# Patient Record
Sex: Female | Born: 1975 | Race: White | Hispanic: No | Marital: Married | Smoking: Former smoker
Health system: Southern US, Community
[De-identification: ages and names within clinical notes are randomized; demographics above are authoritative.]

## PROBLEM LIST (undated history)

## (undated) DIAGNOSIS — C801 Malignant (primary) neoplasm, unspecified: Secondary | ICD-10-CM

## (undated) DIAGNOSIS — I341 Nonrheumatic mitral (valve) prolapse: Secondary | ICD-10-CM

## (undated) DIAGNOSIS — R42 Dizziness and giddiness: Secondary | ICD-10-CM

## (undated) DIAGNOSIS — C4491 Basal cell carcinoma of skin, unspecified: Secondary | ICD-10-CM

## (undated) DIAGNOSIS — G4730 Sleep apnea, unspecified: Secondary | ICD-10-CM

## (undated) DIAGNOSIS — I10 Essential (primary) hypertension: Secondary | ICD-10-CM

## (undated) DIAGNOSIS — K219 Gastro-esophageal reflux disease without esophagitis: Secondary | ICD-10-CM

## (undated) DIAGNOSIS — N8000 Endometriosis of the uterus, unspecified: Secondary | ICD-10-CM

## (undated) DIAGNOSIS — Z889 Allergy status to unspecified drugs, medicaments and biological substances status: Secondary | ICD-10-CM

## (undated) DIAGNOSIS — Z86018 Personal history of other benign neoplasm: Secondary | ICD-10-CM

## (undated) DIAGNOSIS — D219 Benign neoplasm of connective and other soft tissue, unspecified: Secondary | ICD-10-CM

## (undated) DIAGNOSIS — E669 Obesity, unspecified: Secondary | ICD-10-CM

## (undated) DIAGNOSIS — R519 Headache, unspecified: Secondary | ICD-10-CM

## (undated) DIAGNOSIS — R0789 Other chest pain: Secondary | ICD-10-CM

## (undated) DIAGNOSIS — K635 Polyp of colon: Secondary | ICD-10-CM

## (undated) HISTORY — PX: TUBAL LIGATION: SHX77

## (undated) HISTORY — PX: NOVASURE ABLATION: SHX5394

## (undated) HISTORY — PX: POLYPECTOMY: SHX149

## (undated) HISTORY — DX: Basal cell carcinoma of skin, unspecified: C44.91

## (undated) HISTORY — DX: Personal history of other benign neoplasm: Z86.018

---

## 2005-08-02 ENCOUNTER — Observation Stay: Payer: Self-pay | Admitting: Obstetrics and Gynecology

## 2005-08-10 ENCOUNTER — Observation Stay: Payer: Self-pay

## 2005-08-22 ENCOUNTER — Observation Stay: Payer: Self-pay | Admitting: Obstetrics and Gynecology

## 2005-09-13 ENCOUNTER — Observation Stay: Payer: Self-pay | Admitting: Obstetrics and Gynecology

## 2005-09-15 ENCOUNTER — Inpatient Hospital Stay: Payer: Self-pay | Admitting: Obstetrics and Gynecology

## 2005-09-21 ENCOUNTER — Emergency Department: Payer: Self-pay | Admitting: Emergency Medicine

## 2010-03-31 ENCOUNTER — Ambulatory Visit: Payer: Self-pay | Admitting: Obstetrics and Gynecology

## 2011-06-21 ENCOUNTER — Ambulatory Visit: Payer: Self-pay | Admitting: Obstetrics and Gynecology

## 2011-06-23 ENCOUNTER — Ambulatory Visit: Payer: Self-pay | Admitting: Obstetrics and Gynecology

## 2011-06-23 HISTORY — PX: OTHER SURGICAL HISTORY: SHX169

## 2011-06-27 LAB — PATHOLOGY REPORT

## 2011-10-24 DIAGNOSIS — C4491 Basal cell carcinoma of skin, unspecified: Secondary | ICD-10-CM

## 2011-10-24 HISTORY — DX: Basal cell carcinoma of skin, unspecified: C44.91

## 2013-09-01 ENCOUNTER — Ambulatory Visit: Payer: Self-pay | Admitting: Gastroenterology

## 2013-09-02 LAB — PATHOLOGY REPORT

## 2015-04-19 NOTE — H&P (Addendum)
Patient ID: LARESA OSHIRO is a 39 y.o. female presenting for [place], BS for pelvic pain and endometriosis. HPI:  Postablation syndrome with pain intermittently. Is interested in definitive management.  Preop workup: TVUS 02/2015: Ut wnl Post ablation Rt ov wnl Lt ov seen with a simple cyst=0.95 cm and two complex cysts= 1=1.50 cm And 2=-0.94 cm  Pap smear benign 02/2015  Hx of atypical chest pain 2 yrs ago, seen by cardiologist with normal Echo and stress test, not requiring followup. Also has GERD, relieved with omperazole, and that may have been what the chest pain was. Also has OSA- but was not aware, did not have a sleep study and is not on any treatment.  Has a hx of BTL and Novasure endometrial ablation in 2012 for AUB with endometriosis. She still occasionally has spotting, but last month had her first period since the ablation. No dysparunia. Feels crampy.  Endometriosis dx by lap 15 yrs ago. Prior surgery includes cesarean section in 2006, NSVD x1.   Past Medical History:  has a past medical history of Hypertension; Basal cell carcinoma of face (09/2002); Endometriosis of uterus; Ulcer; Obesity; GERD (gastroesophageal reflux disease); MVP (mitral valve prolapse); Atypical chest pain; Headache; Obstructive sleep apnea; and Vertigo.  Past Surgical History:  has past surgical history that includes Tubal ligation (06/23/2011); Hysteroscopy (06/23/2011); Endometrial ablation (06/23/2011); Endometriosis (2001); Cesarean section (2006); and Skin cancer. Family History: family history includes Arthritis in her other; Cancer in her other; Diabetes mellitus in her father; Gout in her father; Heart attack (age of onset: 73) in her father; Hypertension in her father; Kidney disease in her other. Social History:  reports that she has quit smoking. She has never used smokeless tobacco. She reports that she drinks alcohol. She reports that she does not use illicit drugs. OB/GYN History:  OB  History    Gravida Para Term Preterm AB TAB SAB Ectopic Multiple Living   3 2 2  1  1   2       Allergies: has No Known Allergies. Medications:  Current outpatient prescriptions:  . hydrochlorothiazide (HYDRODIURIL) 25 MG tablet, Take 1 tablet (25 mg total) by mouth once daily., Disp: 90 tablet, Rfl: 3 . omeprazole (PRILOSEC) 20 MG DR capsule, Take 1 capsule (20 mg total) by mouth once daily., Disp: 90 capsule, Rfl: 3  Review of Systems  Constitutional: Negative. Negative for fever and fatigue.  HENT: Negative.  Respiratory: Negative. Negative for shortness of breath.  Cardiovascular: Negative for chest pain and palpitations.  Gastrointestinal: Negative for abdominal pain, diarrhea and constipation.  Endocrine: Negative for cold intolerance.  Genitourinary: Negative for dysuria, frequency, hematuria, vaginal bleeding, vaginal discharge, difficulty urinating, menstrual problem, pelvic pain and dyspareunia.  Neurological: Negative for dizziness and light-headedness.  Psychiatric/Behavioral:   No changes in mood    Exam:    Physical Exam  Constitutional: She is oriented to person, place, and time. She appears well-developed and well-nourished. No distress.  Eyes: No scleral icterus.  Neck: Normal range of motion. Neck supple. No tracheal deviation present. No thyromegaly present.  Cardiovascular: Normal rate, regular rhythm and normal heart sounds.  No murmur heard. Pulmonary/Chest: Effort normal and breath sounds normal. She has no wheezes. She has no rales.  Abdominal: She exhibits no distension and no mass. There is no tenderness. There is no rebound and no guarding.  Genitourinary:   External: Tanner stage 5, normal female genitalia without lesions or masses  Bladder: Normal size without masses or tenderness, well-supported  Urethra: No lesions or discharge with palpation. Normal urethral size and location, no  prolapse  Vagina: normal physiological discharge, without lesions or masses  Cervix: normal without lesions or masses  Adnexa: normal bimanual exam without masses or fullness  Uterus: Normal size and position without masses or tenderness.   Anus/Perineum: Normal external exam  Musculoskeletal: Normal range of motion.  Lymphadenopathy:   She has no cervical adenopathy.  Neurological: She is alert and oriented to person, place, and time.  Skin: Skin is warm and dry.  Psychiatric: She has a normal mood and affect.      Impression:   The primary encounter diagnosis was postablation pain syndrome. Preoperative clearance. Diagnoses of Atypical chest pain and Morbid obesity with BMI of 40.0-44.9, adult were also pertinent to this visit.    Plan:   - Post ablation tubal sterilization syndrome (PATSS): Up to 10% of women who have undergone tubal ligation and endometrial ablation experience cyclic or intermittent pelvic pain. The etiology is unknown, but may be from active endometrium that is trapped in the uterine cornua or uterine contracture and intrauterine scarring. The dx is clinical; ultrasound is not reliable at making the dx and it is confirmed surgically. Definitive treatment is hysterectomy. - Hysterectomy was discussed with the patient. In setting of endometriosis, prior C/S and BMI of 45, I have recommended robotically assisted laproscopic hysterectomy.  1. Cardiology clearance prior to surgery - no functional disabilities, normal EKG. Routine cardiac monitoring is recommended. Cardiac risk is low. 2. Consented for [place], BS with cystoscopy and possible fulguration of endometriosis. We will leave her ovaries in place.  I recommend robotically assisted surgery because of her known endometriosis, morbid obesity and hx of prior C/S. Entry into Palmer's point because of prior laproscopic incision in bellybutton.  Patient returns for a preoperative  discussion regarding her plans to proceed with surgical treatment of her postablation sterilization syndrome by RATLH, BS procedure. The patient and I discussed the technical aspects of the procedure including the potential for risks and complications. These include but are not limited to the risk of infection requiring post-operative antibiotics or further procedures. We talked about the risk of injury to adjacent organs including bladder, bowel, ureter, blood vessels or nerves. We talked about the need to convert to an open incision. We talked about the possible need for blood transfusion. We talked aboutpostop complications such asthromboembolic or cardiopulmonary complications. All of her questions were answered. Her preoperative exam was completed and the appropriate consents were signed. She is scheduled to undergo this procedure in the near future.  2 weeks postop f/u  Sherrie George, MD

## 2015-04-23 HISTORY — PX: ABDOMINAL HYSTERECTOMY: SHX81

## 2015-04-27 ENCOUNTER — Encounter
Admission: RE | Admit: 2015-04-27 | Discharge: 2015-04-27 | Disposition: A | Discharge status: Home / Self Care | Payer: 59 | Source: Ambulatory Visit | Attending: Obstetrics and Gynecology | Admitting: Obstetrics and Gynecology

## 2015-04-27 DIAGNOSIS — Z01812 Encounter for preprocedural laboratory examination: Secondary | ICD-10-CM | POA: Insufficient documentation

## 2015-04-27 HISTORY — DX: Nonrheumatic mitral (valve) prolapse: I34.1

## 2015-04-27 HISTORY — DX: Essential (primary) hypertension: I10

## 2015-04-27 HISTORY — DX: Gastro-esophageal reflux disease without esophagitis: K21.9

## 2015-04-27 HISTORY — DX: Malignant (primary) neoplasm, unspecified: C80.1

## 2015-04-27 HISTORY — DX: Sleep apnea, unspecified: G47.30

## 2015-04-27 LAB — ABO/RH: ABO/RH(D): AB POS

## 2015-04-27 LAB — TYPE AND SCREEN
ABO/RH(D): AB POS
Antibody Screen: NEGATIVE

## 2015-04-27 LAB — BASIC METABOLIC PANEL
ANION GAP: 11 (ref 5–15)
BUN: 15 mg/dL (ref 6–20)
CO2: 29 mmol/L (ref 22–32)
Calcium: 9.2 mg/dL (ref 8.9–10.3)
Chloride: 98 mmol/L — ABNORMAL LOW (ref 101–111)
Creatinine, Ser: 0.79 mg/dL (ref 0.44–1.00)
GFR calc non Af Amer: >60 mL/min (ref >60)
Glucose, Bld: 100 mg/dL — ABNORMAL HIGH (ref 65–99)
Potassium: 3.3 mmol/L — ABNORMAL LOW (ref 3.5–5.1)
SODIUM: 138 mmol/L (ref 135–145)

## 2015-04-27 LAB — CBC
HCT: 40.4 % (ref 35.0–47.0)
HEMOGLOBIN: 14 g/dL (ref 12.0–16.0)
MCH: 30 pg (ref 26.0–34.0)
MCHC: 34.8 g/dL (ref 32.0–36.0)
MCV: 86.4 fL (ref 80.0–100.0)
Platelets: 280 10*3/uL (ref 150–440)
RBC: 4.67 MIL/uL (ref 3.80–5.20)
RDW: 13.3 % (ref 11.5–14.5)
WBC: 7.7 10*3/uL (ref 3.6–11.0)

## 2015-04-27 NOTE — Patient Instructions (Signed)
  Your procedure is scheduled on: July 11. 2016 (Monday) Report to Day Surgery. To find out your arrival time please call [phone] between 1PM - 3PM on April 29, 2105 (Friday).  Remember: Instructions that are not followed completely may result in serious medical risk, up to and including death, or upon the discretion of your surgeon and anesthesiologist your surgery may need to be rescheduled.    _x___ 1. Do not eat food or drink liquids after midnight. No gum chewing or hard candies.     ____ 2. No Alcohol for 24 hours before or after surgery.   ____ 3. Bring all medications with you on the day of surgery if instructed.    _x___ 4. Notify your doctor if there is any change in your medical condition     (cold, fever, infections).     Do not wear jewelry, make-up, hairpins, clips or nail polish.  Do not wear lotions, powders, or perfumes. You may wear deodorant.  Do not shave 48 hours prior to surgery. Men may shave face and neck.  Do not bring valuables to the hospital.    [facility] is not responsible for any belongings or valuables.               Contacts, dentures or bridgework may not be worn into surgery.  Leave your suitcase in the car. After surgery it may be brought to your room.  For patients admitted to the hospital, discharge time is determined by your                treatment team.   Patients discharged the day of surgery will not be allowed to drive home.   Please read over the following fact sheets that you were given:   Surgical Site Infection Prevention   ____ Take these medicines the morning of surgery with A SIP OF WATER:    1. Omeprazole (Omeprazole at bedtime on Sunday night)  2.   3.   4.  5.  6.  ____ Fleet Enema (as directed)   _x___ Use CHG Soap as directed  ____ Use inhalers on the day of surgery  ____ Stop metformin 2 days prior to surgery    ____ Take 1/2 of usual insulin dose the night before surgery and none on the morning of surgery.    ____ Stop Coumadin/Plavix/aspirin on   ____ Stop Anti-inflammatories on    ____ Stop supplements until after surgery.    ____ Bring C-Pap to the hospital.

## 2015-04-27 NOTE — Pre-Procedure Instructions (Signed)
Potassium results on 04/27/2015 -- 3.3. Results called and faxed  to Dr. Leafy Ro office. Instructed that patient needed to be started on potassium supplement (spoke to Vinie Sill)

## 2015-05-03 ENCOUNTER — Observation Stay
Admission: RE | Admit: 2015-05-03 | Discharge: 2015-05-04 | Disposition: A | Discharge status: Home / Self Care | Payer: 59 | Source: Ambulatory Visit | Attending: Obstetrics and Gynecology | Admitting: Obstetrics and Gynecology

## 2015-05-03 ENCOUNTER — Encounter: Payer: Self-pay | Admitting: *Deleted

## 2015-05-03 ENCOUNTER — Ambulatory Visit: Payer: 59 | Admitting: Certified Registered Nurse Anesthetist

## 2015-05-03 ENCOUNTER — Encounter
Admission: RE | Disposition: A | Discharge status: Home / Self Care | Payer: Self-pay | Source: Ambulatory Visit | Attending: Obstetrics and Gynecology

## 2015-05-03 DIAGNOSIS — G4733 Obstructive sleep apnea (adult) (pediatric): Secondary | ICD-10-CM | POA: Insufficient documentation

## 2015-05-03 DIAGNOSIS — Z6841 Body Mass Index (BMI) 40.0 and over, adult: Secondary | ICD-10-CM | POA: Diagnosis not present

## 2015-05-03 DIAGNOSIS — Z841 Family history of disorders of kidney and ureter: Secondary | ICD-10-CM | POA: Insufficient documentation

## 2015-05-03 DIAGNOSIS — Z9851 Tubal ligation status: Secondary | ICD-10-CM | POA: Insufficient documentation

## 2015-05-03 DIAGNOSIS — I10 Essential (primary) hypertension: Secondary | ICD-10-CM | POA: Insufficient documentation

## 2015-05-03 DIAGNOSIS — Z302 Encounter for sterilization: Secondary | ICD-10-CM | POA: Diagnosis not present

## 2015-05-03 DIAGNOSIS — Z8249 Family history of ischemic heart disease and other diseases of the circulatory system: Secondary | ICD-10-CM | POA: Diagnosis not present

## 2015-05-03 DIAGNOSIS — I341 Nonrheumatic mitral (valve) prolapse: Secondary | ICD-10-CM | POA: Insufficient documentation

## 2015-05-03 DIAGNOSIS — Z79899 Other long term (current) drug therapy: Secondary | ICD-10-CM | POA: Diagnosis not present

## 2015-05-03 DIAGNOSIS — Z8261 Family history of arthritis: Secondary | ICD-10-CM | POA: Insufficient documentation

## 2015-05-03 DIAGNOSIS — N888 Other specified noninflammatory disorders of cervix uteri: Secondary | ICD-10-CM | POA: Insufficient documentation

## 2015-05-03 DIAGNOSIS — D259 Leiomyoma of uterus, unspecified: Secondary | ICD-10-CM | POA: Insufficient documentation

## 2015-05-03 DIAGNOSIS — N736 Female pelvic peritoneal adhesions (postinfective): Secondary | ICD-10-CM | POA: Diagnosis not present

## 2015-05-03 DIAGNOSIS — Z85828 Personal history of other malignant neoplasm of skin: Secondary | ICD-10-CM | POA: Insufficient documentation

## 2015-05-03 DIAGNOSIS — Z90710 Acquired absence of both cervix and uterus: Secondary | ICD-10-CM | POA: Diagnosis present

## 2015-05-03 DIAGNOSIS — K219 Gastro-esophageal reflux disease without esophagitis: Secondary | ICD-10-CM | POA: Insufficient documentation

## 2015-05-03 DIAGNOSIS — R300 Dysuria: Secondary | ICD-10-CM | POA: Diagnosis not present

## 2015-05-03 DIAGNOSIS — R102 Pelvic and perineal pain: Secondary | ICD-10-CM | POA: Insufficient documentation

## 2015-05-03 DIAGNOSIS — Z9889 Other specified postprocedural states: Secondary | ICD-10-CM | POA: Insufficient documentation

## 2015-05-03 DIAGNOSIS — Z87891 Personal history of nicotine dependence: Secondary | ICD-10-CM | POA: Insufficient documentation

## 2015-05-03 HISTORY — PX: CYSTOSCOPY: SHX5120

## 2015-05-03 HISTORY — PX: ROBOTIC ASSISTED TOTAL HYSTERECTOMY: SHX6085

## 2015-05-03 LAB — I-STAT 4 (NA, K, GLUC, HGB, HCT)
HCT: 38
HEMOGLOBIN: 12.9 g/dL
POTASSIUM: 3.2 mmol/L
SODIUM: 139

## 2015-05-03 LAB — POCT PREGNANCY, URINE: Preg Test, Ur: NEGATIVE

## 2015-05-03 SURGERY — ROBOTIC ASSISTED TOTAL HYSTERECTOMY
Anesthesia: General

## 2015-05-03 MED ORDER — PROPOFOL 10 MG/ML IV BOLUS
INTRAVENOUS | Status: DC | PRN
  Administered 2015-05-03: 180 mg via INTRAVENOUS
  Administered 2015-05-03: 20 mg via INTRAVENOUS

## 2015-05-03 MED ORDER — FUROSEMIDE 10 MG/ML IJ SOLN
INTRAMUSCULAR | Status: DC | PRN
  Administered 2015-05-03: 20 mg via INTRAMUSCULAR

## 2015-05-03 MED ORDER — ONDANSETRON HCL 4 MG/2ML IJ SOLN
INTRAMUSCULAR | Status: DC | PRN
  Administered 2015-05-03: 4 mg via INTRAVENOUS

## 2015-05-03 MED ORDER — PANTOPRAZOLE SODIUM 40 MG PO TBEC
40.0000 mg | DELAYED_RELEASE_TABLET | Freq: Every day | ORAL | Status: DC
  Administered 2015-05-04: 40 mg via ORAL
  Filled 2015-05-03: qty 1

## 2015-05-03 MED ORDER — HYDROMORPHONE HCL 1 MG/ML IJ SOLN
INTRAMUSCULAR | Status: AC
  Administered 2015-05-03: 0.5 mg via INTRAVENOUS
  Filled 2015-05-03: qty 1

## 2015-05-03 MED ORDER — FENTANYL CITRATE (PF) 100 MCG/2ML IJ SOLN
25.0000 ug | INTRAMUSCULAR | Status: AC | PRN
  Administered 2015-05-03 (×6): 25 ug via INTRAVENOUS

## 2015-05-03 MED ORDER — HYDROMORPHONE HCL 1 MG/ML IJ SOLN
0.5000 mg | INTRAMUSCULAR | Status: AC | PRN
  Administered 2015-05-03 (×4): 0.5 mg via INTRAVENOUS

## 2015-05-03 MED ORDER — FLUMAZENIL 0.5 MG/5ML IV SOLN
INTRAVENOUS | Status: DC | PRN
  Administered 2015-05-03: 0.2 mg via INTRAVENOUS

## 2015-05-03 MED ORDER — SODIUM CHLORIDE 0.9 % IR SOLN
Status: DC | PRN
  Administered 2015-05-03: 1000 mL via INTRAVESICAL

## 2015-05-03 MED ORDER — MIDAZOLAM HCL 2 MG/2ML IJ SOLN
INTRAMUSCULAR | Status: DC | PRN
  Administered 2015-05-03: 1 mg via INTRAVENOUS

## 2015-05-03 MED ORDER — KETOROLAC TROMETHAMINE 30 MG/ML IJ SOLN
INTRAMUSCULAR | Status: DC | PRN
  Administered 2015-05-03: 30 mg via INTRAVENOUS

## 2015-05-03 MED ORDER — KCL IN DEXTROSE-NACL 20-5-0.45 MEQ/L-%-% IV SOLN
INTRAVENOUS | Status: DC
  Filled 2015-05-03 (×2): qty 1000

## 2015-05-03 MED ORDER — ONDANSETRON HCL 4 MG/2ML IJ SOLN: 4.0000 mg | Freq: Four times a day (QID) | INTRAMUSCULAR | Status: DC | PRN

## 2015-05-03 MED ORDER — NEOSTIGMINE METHYLSULFATE 10 MG/10ML IV SOLN
INTRAVENOUS | Status: DC | PRN
  Administered 2015-05-03: 1 mg via INTRAVENOUS
  Administered 2015-05-03: 4 mg via INTRAVENOUS

## 2015-05-03 MED ORDER — ONDANSETRON HCL 4 MG/2ML IJ SOLN: 4.0000 mg | Freq: Once | INTRAMUSCULAR | Status: DC | PRN

## 2015-05-03 MED ORDER — ROCURONIUM BROMIDE 100 MG/10ML IV SOLN
INTRAVENOUS | Status: DC | PRN
  Administered 2015-05-03 (×3): 10 mg via INTRAVENOUS
  Administered 2015-05-03: 50 mg via INTRAVENOUS
  Administered 2015-05-03 (×3): 10 mg via INTRAVENOUS
  Administered 2015-05-03: 20 mg via INTRAVENOUS
  Administered 2015-05-03 (×7): 10 mg via INTRAVENOUS

## 2015-05-03 MED ORDER — HYDROCHLOROTHIAZIDE 25 MG PO TABS
25.0000 mg | ORAL_TABLET | Freq: Every day | ORAL | Status: DC
  Administered 2015-05-03 – 2015-05-04 (×2): 25 mg via ORAL
  Filled 2015-05-03 (×2): qty 1

## 2015-05-03 MED ORDER — MORPHINE SULFATE 2 MG/ML IJ SOLN
1.0000 mg | INTRAMUSCULAR | Status: DC | PRN
  Administered 2015-05-03: 1 mg via INTRAVENOUS
  Filled 2015-05-03: qty 1

## 2015-05-03 MED ORDER — LIDOCAINE HCL (CARDIAC) 20 MG/ML IV SOLN
INTRAVENOUS | Status: DC | PRN
  Administered 2015-05-03: 100 mg via INTRAVENOUS

## 2015-05-03 MED ORDER — GLYCOPYRROLATE 0.2 MG/ML IJ SOLN
INTRAMUSCULAR | Status: DC | PRN
  Administered 2015-05-03: 0.6 mg via INTRAVENOUS
  Administered 2015-05-03: 0.2 mg via INTRAVENOUS

## 2015-05-03 MED ORDER — FENTANYL CITRATE (PF) 100 MCG/2ML IJ SOLN
INTRAMUSCULAR | Status: AC
  Administered 2015-05-03: 25 ug via INTRAVENOUS
  Filled 2015-05-03: qty 2

## 2015-05-03 MED ORDER — FENTANYL CITRATE (PF) 100 MCG/2ML IJ SOLN
INTRAMUSCULAR | Status: DC | PRN
  Administered 2015-05-03 (×3): 50 ug via INTRAVENOUS
  Administered 2015-05-03: 100 ug via INTRAVENOUS
  Administered 2015-05-03 (×7): 50 ug via INTRAVENOUS

## 2015-05-03 MED ORDER — LACTATED RINGERS IV SOLN
INTRAVENOUS | Status: DC
  Administered 2015-05-03 (×2): via INTRAVENOUS

## 2015-05-03 MED ORDER — CEFAZOLIN SODIUM-DEXTROSE 2-3 GM-% IV SOLR
INTRAVENOUS | Status: AC
  Administered 2015-05-03 (×2): 2 g via INTRAVENOUS
  Filled 2015-05-03: qty 50

## 2015-05-03 MED ORDER — DEXAMETHASONE SODIUM PHOSPHATE 4 MG/ML IJ SOLN
INTRAMUSCULAR | Status: DC | PRN
  Administered 2015-05-03: 8 mg via INTRAVENOUS

## 2015-05-03 MED ORDER — OXYCODONE-ACETAMINOPHEN 5-325 MG PO TABS: 1.0000 | ORAL_TABLET | ORAL | Status: DC | PRN

## 2015-05-03 MED ORDER — LACTATED RINGERS IV SOLN
INTRAVENOUS | Status: DC | PRN
  Administered 2015-05-03: 08:00:00 via INTRAVENOUS

## 2015-05-03 MED ORDER — CEFAZOLIN SODIUM-DEXTROSE 2-3 GM-% IV SOLR: 2.0000 g | INTRAVENOUS | Status: DC

## 2015-05-03 MED ORDER — KETOROLAC TROMETHAMINE 30 MG/ML IJ SOLN: 30.0000 mg | Freq: Once | INTRAMUSCULAR | Status: DC

## 2015-05-03 MED ORDER — LACTATED RINGERS IV SOLN
INTRAVENOUS | Status: DC
  Administered 2015-05-03 – 2015-05-04 (×2): via INTRAVENOUS

## 2015-05-03 MED ORDER — NALOXONE HCL 0.4 MG/ML IJ SOLN
INTRAMUSCULAR | Status: DC | PRN
  Administered 2015-05-03: 40 ug via INTRAVENOUS

## 2015-05-03 MED ORDER — LIDOCAINE HCL (PF) 1 % IJ SOLN
INTRAMUSCULAR | Status: AC
  Filled 2015-05-03: qty 30

## 2015-05-03 MED ORDER — KETOROLAC TROMETHAMINE 30 MG/ML IJ SOLN
30.0000 mg | Freq: Four times a day (QID) | INTRAMUSCULAR | Status: AC
  Administered 2015-05-03 (×2): 30 mg via INTRAVENOUS
  Filled 2015-05-03 (×2): qty 1

## 2015-05-03 MED ORDER — ONDANSETRON HCL 4 MG PO TABS: 4.0000 mg | ORAL_TABLET | Freq: Four times a day (QID) | ORAL | Status: DC | PRN

## 2015-05-03 MED ORDER — OXYCODONE-ACETAMINOPHEN 5-325 MG PO TABS
2.0000 | ORAL_TABLET | ORAL | Status: DC | PRN
  Administered 2015-05-03 – 2015-05-04 (×5): 2 via ORAL
  Filled 2015-05-03 (×5): qty 2

## 2015-05-03 MED ORDER — ACETAMINOPHEN 10 MG/ML IV SOLN
INTRAVENOUS | Status: AC
  Filled 2015-05-03: qty 100

## 2015-05-03 MED ORDER — ACETAMINOPHEN 10 MG/ML IV SOLN
INTRAVENOUS | Status: DC | PRN
  Administered 2015-05-03: 1000 mg via INTRAVENOUS

## 2015-05-03 SURGICAL SUPPLY — 66 items
BAG URO DRAIN 2000ML W/SPOUT (MISCELLANEOUS) ×4 IMPLANT
BARRIER ADHS 3X4 INTERCEED (GAUZE/BANDAGES/DRESSINGS) ×2 IMPLANT
BLADE SURG SZ11 CARB STEEL (BLADE) ×2 IMPLANT
CANISTER SUCT 1200ML W/VALVE (MISCELLANEOUS) ×2 IMPLANT
CANNULA SEALS 8.5MM (CANNULA) ×1
CATH FOLEY 2WAY  5CC 16FR (CATHETERS) ×1
CATH URTH 16FR FL 2W BLN LF (CATHETERS) ×1 IMPLANT
CHLORAPREP W/TINT 26ML (MISCELLANEOUS) ×2 IMPLANT
CORD BIP STRL DISP 12FT (MISCELLANEOUS) ×2 IMPLANT
COVER TIP SHEARS 8 DVNC (MISCELLANEOUS) ×1 IMPLANT
COVER TIP SHEARS 8MM DA VINCI (MISCELLANEOUS) ×1
CRADLE LAMINECT ARM (MISCELLANEOUS) ×2 IMPLANT
DEFOGGER SCOPE WARMER CLEARIFY (MISCELLANEOUS) ×2 IMPLANT
DRAPE 3 ARM ACCESS DA VINCI (DRAPES) ×1
DRAPE 3 ARM ACCESS DVNC (DRAPES) ×1 IMPLANT
DRAPE LEGGINS SURG 28X43 STRL (DRAPES) ×2 IMPLANT
DRAPE SHEET LG 3/4 BI-LAMINATE (DRAPES) IMPLANT
DRAPE UNDER BUTTOCK W/FLU (DRAPES) ×2 IMPLANT
DRSG TEGADERM 4X4.75 (GAUZE/BANDAGES/DRESSINGS) ×2 IMPLANT
FILTER LAP SMOKE EVAC STRL (MISCELLANEOUS) ×2 IMPLANT
GLOVE BIO SURGEON STRL SZ 6.5 (GLOVE) ×16 IMPLANT
GLOVE INDICATOR 7.0 STRL GRN (GLOVE) ×16 IMPLANT
GOWN STRL REUS W/ TWL LRG LVL3 (GOWN DISPOSABLE) ×8 IMPLANT
GOWN STRL REUS W/TWL LRG LVL3 (GOWN DISPOSABLE) ×8
GRASPER SUT TROCAR 14GX15 (MISCELLANEOUS) ×2 IMPLANT
IRRIGATION STRYKERFLOW (MISCELLANEOUS) ×1 IMPLANT
IRRIGATOR STRYKERFLOW (MISCELLANEOUS) ×2
IV NS 1000ML (IV SOLUTION) ×1
IV NS 1000ML BAXH (IV SOLUTION) ×1 IMPLANT
JELLY LUB 2OZ STRL (MISCELLANEOUS) ×1
JELLY LUBE 2OZ STRL (MISCELLANEOUS) ×1 IMPLANT
KIT RM TURNOVER CYSTO AR (KITS) ×2 IMPLANT
LABEL OR SOLS (LABEL) ×2 IMPLANT
LIQUID BAND (GAUZE/BANDAGES/DRESSINGS) ×2 IMPLANT
MANIPULATOR VCARE LG CRV RETR (MISCELLANEOUS) IMPLANT
MANIPULATOR VCARE STD CRV RETR (MISCELLANEOUS) ×2 IMPLANT
NS IRRIG 1000ML POUR BTL (IV SOLUTION) ×2 IMPLANT
OCCLUDER COLPOPNEUMO (BALLOONS) ×2 IMPLANT
PACK GYN LAPAROSCOPIC (MISCELLANEOUS) ×2 IMPLANT
PAD GROUND ADULT SPLIT (MISCELLANEOUS) ×2 IMPLANT
PAD OB MATERNITY 4.3X12.25 (PERSONAL CARE ITEMS) ×2 IMPLANT
PAD PREP 24X41 OB/GYN DISP (PERSONAL CARE ITEMS) IMPLANT
PAD TRENDELENBURG OR TABLE (MISCELLANEOUS) ×2 IMPLANT
SCISSORS METZENBAUM CVD 33 (INSTRUMENTS) ×2 IMPLANT
SEAL CANN 8.5 DVNC (CANNULA) ×1 IMPLANT
SET CYSTO W/LG BORE CLAMP LF (SET/KITS/TRAYS/PACK) ×2 IMPLANT
SOLUTION ELECTROLUBE (MISCELLANEOUS) ×2 IMPLANT
SUT DVC VLOC 180 0 12IN GS21 (SUTURE) ×2
SUT MNCRL AB 4-0 PS2 18 (SUTURE) ×2 IMPLANT
SUT VIC AB 0 CT2 27 (SUTURE) IMPLANT
SUT VIC AB 1 CT1 36 (SUTURE) IMPLANT
SUT VIC AB 2-0 CT1 27 (SUTURE)
SUT VIC AB 2-0 CT1 TAPERPNT 27 (SUTURE) IMPLANT
SUT VIC AB 4-0 SH 27 (SUTURE)
SUT VIC AB 4-0 SH 27XANBCTRL (SUTURE) IMPLANT
SUT VICRYL 0 AB UR-6 (SUTURE) ×2 IMPLANT
SUTURE DVC VLC 180 0 12IN GS21 (SUTURE) ×1 IMPLANT
SYR 50ML LL SCALE MARK (SYRINGE) ×4 IMPLANT
SYR BULB IRRIG 60ML STRL (SYRINGE) ×2 IMPLANT
SYRINGE 10CC LL (SYRINGE) ×2 IMPLANT
TROCAR 12M 150ML BLUNT (TROCAR) ×4 IMPLANT
TROCAR DISP BLADELESS 8 DVNC (TROCAR) ×1 IMPLANT
TROCAR DISP BLADELESS 8MM (TROCAR) ×1
TROCAR ENDO BLADELESS 11MM (ENDOMECHANICALS) ×2 IMPLANT
TROCAR XCEL 12X100 BLDLESS (ENDOMECHANICALS) ×2 IMPLANT
TUBING INSUFFLATOR HEATED (MISCELLANEOUS) ×2 IMPLANT

## 2015-05-03 NOTE — Transfer of Care (Signed)
Immediate Anesthesia Transfer of Care Note  Patient: Pamela Lee  Procedure(s) Performed: Procedure(s): ROBOTIC ASSISTED TOTAL HYSTERECTOMY/BIL.SALPINGECTOMY (N/A) CYSTOSCOPY (N/A)  Patient Location: PACU  Anesthesia Type:General  Level of Consciousness: awake and alert   Airway & Oxygen Therapy: Patient Spontanous Breathing and Patient connected to face mask oxygen  Post-op Assessment: Report given to RN and Post -op Vital signs reviewed and stable  Post vital signs: Reviewed and stable  Last Vitals:  Filed Vitals:   05/03/15 1322  BP: 131/76  Pulse: 96  Temp: 37.2 C  Resp: 16    Complications: No apparent anesthesia complications

## 2015-05-03 NOTE — Anesthesia Postprocedure Evaluation (Addendum)
  Anesthesia Post-op Note  Patient: Pamela Lee  Procedure(s) Performed: Procedure(s): ROBOTIC ASSISTED TOTAL HYSTERECTOMY/BIL.SALPINGECTOMY (N/A) CYSTOSCOPY (N/A)  Anesthesia type:General  Patient location: PACU  Post pain: Pain level controlled  Post assessment: Post-op Vital signs reviewed, Patient's Cardiovascular Status Stable, Respiratory Function Stable, Patent Airway and No signs of Nausea or vomiting  Post vital signs: Reviewed and stable  Last Vitals:  Filed Vitals:   05/03/15 1337  BP: 126/73  Pulse: 82  Temp:   Resp: 20    Level of consciousness: awake, alert  and patient cooperative  Complications: No apparent anesthesia complications Patient positioned by OR/surical staff

## 2015-05-03 NOTE — Anesthesia Procedure Notes (Signed)
Procedure Name: Intubation Performed by: Demetrius Charity Pre-anesthesia Checklist: Patient identified, Emergency Drugs available, Suction available, Patient being monitored and Timeout performed Patient Re-evaluated:Patient Re-evaluated prior to inductionOxygen Delivery Method: Circle system utilized Preoxygenation: Pre-oxygenation with 100% oxygen Intubation Type: IV induction Ventilation: Oral airway inserted - appropriate to patient size Laryngoscope Size: Mac and 3 Grade View: Grade II Tube type: Oral Tube size: 7.0 mm Number of attempts: 1 Placement Confirmation: ETT inserted through vocal cords under direct vision,  positive ETCO2 and CO2 detector Secured at: 21 cm Tube secured with: Tape Dental Injury: Teeth and Oropharynx as per pre-operative assessment

## 2015-05-03 NOTE — Anesthesia Preprocedure Evaluation (Signed)
Anesthesia Evaluation  Patient identified by MRN, date of birth, ID band Patient awake    Reviewed: Allergy & Precautions, NPO status , Patient's Chart, lab work & pertinent test results  Airway Mallampati: II  TM Distance: >3 FB Neck ROM: Full    Dental  (+) Caps   Pulmonary sleep apnea , former smoker,    Pulmonary exam normal       Cardiovascular hypertension, Normal cardiovascular exam    Neuro/Psych negative neurological ROS  negative psych ROS   GI/Hepatic Neg liver ROS, GERD-  Medicated and Controlled,  Endo/Other  negative endocrine ROS  Renal/GU negative Renal ROS  negative genitourinary   Musculoskeletal negative musculoskeletal ROS (+)   Abdominal (+) + obese,   Peds  Hematology negative hematology ROS (+)   Anesthesia Other Findings   Reproductive/Obstetrics                             Anesthesia Physical Anesthesia Plan  ASA: II  Anesthesia Plan: General   Post-op Pain Management:    Induction: Intravenous and Rapid sequence  Airway Management Planned: Oral ETT  Additional Equipment:   Intra-op Plan:   Post-operative Plan: Extubation in OR  Informed Consent: I have reviewed the patients History and Physical, chart, labs and discussed the procedure including the risks, benefits and alternatives for the proposed anesthesia with the patient or authorized representative who has indicated his/her understanding and acceptance.   Dental advisory given  Plan Discussed with: Surgeon and CRNA  Anesthesia Plan Comments:         Anesthesia Quick Evaluation

## 2015-05-03 NOTE — Progress Notes (Signed)
POD#0 Procedure(s) (LRB): ROBOTIC ASSISTED TOTAL HYSTERECTOMY/BIL.SALPINGECTOMY (N/A) CYSTOSCOPY (N/A)  Subjective: Patient reports tolerating PO.  Significant bilateral bicep pain with difficulty moving upper arms without pain. Is actually unable to move arms flexing at elbow. Deltoids working well. No pain with passive movement. Reflexes adequate at biceps. Grip strength adequate and bilaterally symmetrical. Some skin erythema from folded BP cuff on lateral right arm. Skin temp not elevated at this site. Skin not tense. No pallor. No lower arm decreased sensation. No edema.  Objective: I have reviewed patient's vital signs, intake and output, medications and labs.  General: alert, cooperative and mild distress Resp: clear to auscultation bilaterally Cardio: regular rate and rhythm, S1, S2 normal, no murmur, click, rub or gallop GI: soft, non-tender; bowel sounds normal; no masses,  no organomegaly Extremities: as above in HPI. SCDs on lower extremities Vaginal Bleeding: minimal See HPI  Assessment: s/p Procedure(s): ROBOTIC ASSISTED TOTAL HYSTERECTOMY/BIL.SALPINGECTOMY (N/A) CYSTOSCOPY (N/A): stable, tolerating diet and upper ext pain  Plan: Advance diet Encourage ambulation  Monitor upper ext overnight. If worsening pain, consider  compartment syndrome. Would consider neuro consult in am if no improvement. Currently, with her exam intact and no edema or loss of sensation, I am working with the diagnosis of positioning that compressed her bicep and has caused muscle bruising bilaterally. We did position using sleds. Will monitor.      Benjaman Kindler 05/03/2015, 6:37 PM

## 2015-05-03 NOTE — Progress Notes (Signed)
Unable to add 5 LDA to flowsheets. Assessment below   1. Umbilicus; closed with liquid skin adhesive; margin attached and approximated, no drainage  2. Mid upper left abdomen; closed with liquid skin adhesive; margin attached and approximated, no drainage  3. Left Lower abdomen; closed with liquid skin adhesive; margin attached and approximated, no drainage   4. Mid Lower abdomen; closed with liquid skin adhesive; margin attached and approximated, no drainage   5 Right Lower abdomen; closed with liquid skin adhesive; margin attached and approximated, no drainage

## 2015-05-03 NOTE — Op Note (Signed)
Operative Note   SURGERY DATE: 05/03/2015  PRE-OP DIAGNOSIS:  1. Pelvic pain - postablation/BTL syndrome 2. Endometriosis 3. Prior pelvic surgery x2   POST-OP DIAGNOSIS:  1.  Abdominal-pelvic adhesive disease.  2. Same as above  PROCEDURES: 1. Exam under anesthesia 2. ROBOT ASSISTED LAPAROSCOPY, SURGICAL, WITH TOTAL HYSTERECTOMY, FOR UTERUS 250 G OR LESS; WITH REMOVAL OF TUBE(S)  3. Lysis of pelvic adhesions for >50% of the case 4. Cystoscopy   SURGEON: Angelina Pih, MD, MPH  ASSISTANT(S):  Lyla Glassing, PA-C  ANESTHESIA: General   ESTIMATED BLOOD LOSS: 100 mL  DRAINS: Foley catheter  TOTAL IV FLUIDS: 5427 mL  COMPLICATIONS: None  DISPOSITION: PACU - hemodynamically stable.  CONDITION: stable  Antibiotics: Ancef 2g iv within 1 hour prior to start of procedure  VTE prophylaxis: was ordered perioperatively.  Wound: Clean contaminated  IMPLANTS: None  INDICATIONS: Post-ablation pain syndrome with pelvic pain, hx of BTL and Novasure ablation in 2012. Endometriosis pathologically dx 15 yrs ago. Prior surgery includes C/S in 2006.  OPERATIVE FINDINGS:  Pelvic: External genitalia negative for lesions. Vagina negative. Adenxa negative for masses or nodularity. Cervix without gross lesions. Uterus mobile, anteverted, small.  Intraoperative findings revealed a normal upper abdomen including bowel, diaphragmatic surfaces, stomach, and omentum.  The uterus was normal.  The right and left ovaries appeared normal.  Bilateral tubes well-adhesed to bilateral ovaries. Adhesions between uterus and bladder were dense and significant. Take down of these adhesions comprised >50% of the time of this case.    PROCEDURE IN DETAIL: After informed consent was obtained, the patient was taken to the operating room where general anesthesia was obtained without difficulty. The patient was positioned in the dorsal lithotomy position in [place] and her arms were carefully tucked  at her sides and the usual precautions were taken.  She was prepped and draped in normal sterile fashion.  Time-out was performed and a Foley catheter was placed into the bladder. A standard VCare uterine manipulator was then placed in the uterus without incident.  Preoperative prophylactic antibiotics were given through her iv.  A Veress needle was carefully introduced at Palmer's point, avoiding her prior umbilical incision, with the gas on low-flow, and entry into the peritoneal cavity was assured with a low pressure noted. Pneumoperitoneum was created to a pressure of 15 mm Hg. After infiltration of local anesthetic at the proposed trocar sites, an 8 mm incision was created infraumbilically. The camera port was placed and the abdomin surveyed, noting intact bowel below the site of entry. A survey of the pelvis and upper abdomen revealed the above findings. Right and left lateral 8-mm robotic ports and a 5-12 mm right-sided assistant port were placed under direct visualization.  The patient was placed in deepTrendelenburg and the bowel was displaced up into the upper abdomen. The robot was left side docked. The instruments were placed under direct visualization.   The ureters were identified bilaterally coursing outside of the operative field. Round ligaments were divided on each side with the EndoShears and the retroperitoneal space was opened bilaterally. The posterior leaflet of the broad was taken down to the level of the IP ligament. The anterior leaflet of the broad ligament was carefully taken down to the midline.  A bladder flap was created and the bladder was dissected down off the lower uterine segment and cervix using endoshears and electrocautery.   The Fallopian tubes were divided from the ovaries, and care taken to hemostatically transect the utero-ovarian ligament. The peritoneum  was taken down to the level of the internal os, and the uterine arteries skeletonized. With strong cephalad  pressure from the V-care, bipolar cautery was used to seal and transect the uterine arteries, and the pedicles allowed to fall away laterally.  A colpotomy was performed circumferentially along the V-Care ring with monopolar electrocautery and the cervix was incised from the vagina. The specimen was removed through the vagina.  A pneumo balloon was placed in the vagina and the vaginal cuff was then closed in a running continuous fashion using the  0 V-Lock suture with careful attention to include the vaginal cuff angles and the vaginal mucosa within the closure.  Hemostasis was secured with suction-irrigation.The intraperitoneal pressure was dropped, and all planes of dissection, vascular pedicles and the vaginal cuff were found to be hemostatic.    An uncomplicated full cystoscopy was performed. Visualization from the intraabdominal cavity as well as through the cystoscopy noted that the bladder mucosa was intact at all points of the clock. Strong efflux was noted from both ureteral orifices.  The robot was undocked. The assistant port trocar was removed and the fascia was closed with 0 Vicryl suture. The lateral trocars were removed under visualization.  The CO2 gas was released and several deep breaths given to remove any remaining CO2 from the peritoneal cavity.  The skin incisions were closed with 4-0 Monocryl subcuticular stitch and pressure dressings placed.  Anesthesia was reversed without difficulty.  The patient tolerated the procedure well.  Sponge, lap and needle counts were correct x2.  The patient was taken to recovery room in excellent condition.

## 2015-05-03 NOTE — H&P (Signed)
  Date of Initial H&P: 03/02/15  History reviewed, patient examined, no change in status, stable for surgery.

## 2015-05-04 DIAGNOSIS — Z302 Encounter for sterilization: Secondary | ICD-10-CM | POA: Diagnosis not present

## 2015-05-04 LAB — SURGICAL PATHOLOGY

## 2015-05-04 LAB — URINALYSIS COMPLETE WITH MICROSCOPIC (ARMC ONLY)
Bilirubin Urine: NEGATIVE
Glucose, UA: NEGATIVE mg/dL
Ketones, ur: NEGATIVE mg/dL
Nitrite: NEGATIVE
Protein, ur: 30 mg/dL — AB
Specific Gravity, Urine: 1.019 (ref 1.005–1.030)
pH: 6 (ref 5.0–8.0)

## 2015-05-04 LAB — CBC
HCT: 35.9 % (ref 35.0–47.0)
Hemoglobin: 12.4 g/dL (ref 12.0–16.0)
MCH: 30 pg (ref 26.0–34.0)
MCHC: 34.5 g/dL (ref 32.0–36.0)
MCV: 86.9 fL (ref 80.0–100.0)
Platelets: 250 10*3/uL (ref 150–440)
RBC: 4.13 MIL/uL (ref 3.80–5.20)
RDW: 13.1 % (ref 11.5–14.5)
WBC: 10 10*3/uL (ref 3.6–11.0)

## 2015-05-04 MED ORDER — POTASSIUM CHLORIDE CRYS ER 20 MEQ PO TBCR
40.0000 meq | EXTENDED_RELEASE_TABLET | Freq: Two times a day (BID) | ORAL | Status: DC
  Administered 2015-05-04 (×2): 40 meq via ORAL
  Filled 2015-05-04 (×2): qty 2

## 2015-05-04 MED ORDER — IBUPROFEN 600 MG PO TABS: 600.0000 mg | ORAL_TABLET | Freq: Four times a day (QID) | ORAL | Status: DC | PRN

## 2015-05-04 MED ORDER — KCL-LACTATED RINGERS 20 MEQ/L IV SOLN
INTRAVENOUS | Status: DC
  Filled 2015-05-04 (×3): qty 1000

## 2015-05-04 MED ORDER — IBUPROFEN 600 MG PO TABS: 600.0000 mg | ORAL_TABLET | Freq: Four times a day (QID) | ORAL | Status: AC | PRN

## 2015-05-04 MED ORDER — POTASSIUM CHLORIDE CRYS ER 20 MEQ PO TBCR: 40.0000 meq | EXTENDED_RELEASE_TABLET | Freq: Once | ORAL | Status: DC

## 2015-05-04 MED ORDER — DOCUSATE SODIUM 100 MG PO CAPS
100.0000 mg | ORAL_CAPSULE | Freq: Every day | ORAL | Status: DC
  Administered 2015-05-04: 100 mg via ORAL
  Filled 2015-05-04: qty 1

## 2015-05-04 MED ORDER — OXYCODONE-ACETAMINOPHEN 5-325 MG PO TABS: 1.0000 | ORAL_TABLET | ORAL | Status: DC | PRN

## 2015-05-04 MED ORDER — DOCUSATE SODIUM 100 MG PO CAPS: 100.0000 mg | ORAL_CAPSULE | Freq: Every day | ORAL | Status: DC

## 2015-05-04 NOTE — Progress Notes (Signed)
Patient discharge to home via wheelchair with spouse. Prescriptions given to patient.

## 2015-05-04 NOTE — Discharge Summary (Signed)
Physician Discharge Summary  Patient ID: Pamela Lee MRN: [id number] DOB/AGE: August 26, 1976 39 y.o.  Admit date: 05/03/2015 Discharge date: 05/04/2015  Admission Diagnoses:  Discharge Diagnoses:  Active Problems:   S/P hysterectomy   Discharged Condition: good  Hospital Course: Admitted after [place], BS for pelvic pain and endometriosis, postablation syndrome. Post op pt c/o significant bilateral upper ext pain without decreased ROM, without decreased sensation and a normal neuro exam. Pain improved by POD#1, right arm still painful and limiting purposeful motion. However, with normal exam will continue to expect improvement.  Dysuria postop day #1. Restarted beta blocker POD#1  Consults: None  Significant Diagnostic Studies: labs: K low at 2.9  Treatments: IV hydration and analgesia: Percocet, IV KCl supplementation. UA/Culture  Discharge Exam: Blood pressure 127/78, pulse 72, temperature 98.6 F (37 C), temperature source Oral, resp. rate 18, height 5\' 2"  (1.575 m), weight 113.399 kg (250 lb), SpO2 96 %. General appearance: alert, cooperative and no distress  Disposition:  Urine culture and UA prior to d/c To home in stable condition.    Medication List    TAKE these medications        acetaminophen 500 MG tablet  Commonly known as:  TYLENOL  Take 1,000 mg by mouth every 6 (six) hours as needed for mild pain or headache.     cetirizine 10 MG tablet  Commonly known as:  ZYRTEC  Take 10 mg by mouth daily as needed for allergies.     docusate sodium 100 MG capsule  Commonly known as:  COLACE  Take 1 capsule (100 mg total) by mouth daily.     hydrochlorothiazide 25 MG tablet  Commonly known as:  HYDRODIURIL  Take 25 mg by mouth daily.     omeprazole 20 MG capsule  Commonly known as:  PRILOSEC  Take 20 mg by mouth daily.     oxyCODONE-acetaminophen 5-325 MG per tablet  Commonly known as:  PERCOCET/ROXICET  Take 1 tablet by mouth every 4 (four) hours as needed for  moderate pain.     potassium chloride SA 20 MEQ tablet  Commonly known as:  K-DUR,KLOR-CON  Take 2 tablets (40 mEq total) by mouth once.           Follow-up Information    Follow up with Benjaman Kindler, MD In 2 weeks.   Specialty:  Obstetrics and Gynecology   Why:  For postop check   Contact information:   [place] [address] [phone]       Follow up with Benjaman Kindler, MD. Call in 1 week.   Specialty:  Obstetrics and Gynecology   Why:  If symptoms worsen in arm   Contact information:   [address] [place] [address] [phone]       Signed: Benjaman Kindler 05/04/2015, 1:01 PM

## 2015-05-04 NOTE — Progress Notes (Signed)
1 Day Post-Op Procedure(s) (LRB): ROBOTIC ASSISTED TOTAL HYSTERECTOMY/BIL.SALPINGECTOMY  CYSTOSCOPY  Subjective: Patient reports tolerating PO, + flatus and no problems voiding.    Objective: I have reviewed patient's vital signs, intake and output, medications and labs.  General: alert, cooperative and no distress Resp: clear to auscultation bilaterally Cardio: regular rate and rhythm, S1, S2 normal, no murmur, click, rub or gallop GI: soft, non-tender; bowel sounds normal; no masses,  no organomegaly Extremities: improved range of motion in upper extermeties, L>R, resolvigin erythema. Right arm with firm-feeling bicept muscle that is not-painful to passive motion, but tender to deep palpation.  Vaginal Bleeding: minimal  Assessment: s/p Procedure(s): ROBOTIC ASSISTED TOTAL HYSTERECTOMY/BIL.SALPINGECTOMY (N/A) CYSTOSCOPY (N/A): stable  Plan: Advance diet Encourage ambulation Advance to PO medication u/a and culture for frequency and pressure     Nassim Cosma 05/04/2015, 8:02 AM

## 2015-05-04 NOTE — Discharge Instructions (Signed)

## 2015-05-04 NOTE — Progress Notes (Signed)
RN Mason Jim Talked to [facility] MD in person during morning rounds 0840 about IV infiltration.  Per MD may start on oral K+ tabs twice a day (one now and one at 1500).  D/C IV fluids with K+.  Dr.Beasley will round again at lunch time to discuss possible discharge with pt. Repeat serum K+ at 5 pm if pt is still here per MD verbal order

## 2015-05-05 LAB — BASIC METABOLIC PANEL
Anion gap: 9 (ref 5–15)
BUN: 11 mg/dL (ref 6–20)
CALCIUM: 8.4 mg/dL — AB (ref 8.9–10.3)
CO2: 29 mmol/L (ref 22–32)
Chloride: 99 mmol/L — ABNORMAL LOW (ref 101–111)
Creatinine, Ser: 0.72 mg/dL (ref 0.44–1.00)
GFR calc Af Amer: >60 mL/min (ref >60)
GFR calc non Af Amer: >60 mL/min (ref >60)
Glucose, Bld: 106 mg/dL — ABNORMAL HIGH (ref 65–99)
POTASSIUM: 2.9 mmol/L — AB (ref 3.5–5.1)
Sodium: 137 mmol/L (ref 135–145)

## 2015-05-06 LAB — URINE CULTURE: SPECIAL REQUESTS: NORMAL

## 2015-07-15 ENCOUNTER — Other Ambulatory Visit: Payer: Self-pay | Admitting: Obstetrics and Gynecology

## 2015-07-15 DIAGNOSIS — Z1231 Encounter for screening mammogram for malignant neoplasm of breast: Secondary | ICD-10-CM

## 2015-07-21 ENCOUNTER — Ambulatory Visit
Admission: RE | Admit: 2015-07-21 | Discharge: 2015-07-21 | Disposition: A | Discharge status: Home / Self Care | Payer: 59 | Source: Ambulatory Visit | Attending: Obstetrics and Gynecology | Admitting: Obstetrics and Gynecology

## 2015-07-21 DIAGNOSIS — Z1231 Encounter for screening mammogram for malignant neoplasm of breast: Secondary | ICD-10-CM | POA: Insufficient documentation

## 2015-07-29 DIAGNOSIS — Z86018 Personal history of other benign neoplasm: Secondary | ICD-10-CM

## 2015-07-29 HISTORY — DX: Personal history of other benign neoplasm: Z86.018

## 2016-07-18 ENCOUNTER — Other Ambulatory Visit: Payer: Self-pay | Admitting: Obstetrics and Gynecology

## 2016-07-18 DIAGNOSIS — Z1231 Encounter for screening mammogram for malignant neoplasm of breast: Secondary | ICD-10-CM

## 2016-08-10 ENCOUNTER — Ambulatory Visit
Admission: RE | Admit: 2016-08-10 | Discharge: 2016-08-10 | Disposition: A | Discharge status: Home / Self Care | Payer: 59 | Source: Ambulatory Visit | Attending: Obstetrics and Gynecology | Admitting: Obstetrics and Gynecology

## 2016-08-10 DIAGNOSIS — Z1231 Encounter for screening mammogram for malignant neoplasm of breast: Secondary | ICD-10-CM

## 2017-02-15 DIAGNOSIS — H40053 Ocular hypertension, bilateral: Secondary | ICD-10-CM | POA: Diagnosis not present

## 2017-03-08 DIAGNOSIS — K219 Gastro-esophageal reflux disease without esophagitis: Secondary | ICD-10-CM | POA: Diagnosis not present

## 2017-03-08 DIAGNOSIS — I10 Essential (primary) hypertension: Secondary | ICD-10-CM | POA: Diagnosis not present

## 2017-03-08 DIAGNOSIS — Z0000 Encounter for general adult medical examination without abnormal findings: Secondary | ICD-10-CM | POA: Diagnosis not present

## 2017-03-08 DIAGNOSIS — Z23 Encounter for immunization: Secondary | ICD-10-CM | POA: Diagnosis not present

## 2017-05-03 DIAGNOSIS — J069 Acute upper respiratory infection, unspecified: Secondary | ICD-10-CM | POA: Diagnosis not present

## 2017-05-03 DIAGNOSIS — R300 Dysuria: Secondary | ICD-10-CM | POA: Diagnosis not present

## 2017-05-03 DIAGNOSIS — N390 Urinary tract infection, site not specified: Secondary | ICD-10-CM | POA: Diagnosis not present

## 2017-06-22 DIAGNOSIS — G4733 Obstructive sleep apnea (adult) (pediatric): Secondary | ICD-10-CM | POA: Diagnosis not present

## 2017-07-18 ENCOUNTER — Other Ambulatory Visit: Payer: Self-pay | Admitting: Obstetrics and Gynecology

## 2017-07-18 DIAGNOSIS — Z01419 Encounter for gynecological examination (general) (routine) without abnormal findings: Secondary | ICD-10-CM | POA: Diagnosis not present

## 2017-07-18 DIAGNOSIS — Z1231 Encounter for screening mammogram for malignant neoplasm of breast: Secondary | ICD-10-CM | POA: Diagnosis not present

## 2017-08-02 DIAGNOSIS — Z1283 Encounter for screening for malignant neoplasm of skin: Secondary | ICD-10-CM | POA: Diagnosis not present

## 2017-08-02 DIAGNOSIS — D485 Neoplasm of uncertain behavior of skin: Secondary | ICD-10-CM | POA: Diagnosis not present

## 2017-08-02 DIAGNOSIS — Z85828 Personal history of other malignant neoplasm of skin: Secondary | ICD-10-CM | POA: Diagnosis not present

## 2017-08-05 DIAGNOSIS — Z23 Encounter for immunization: Secondary | ICD-10-CM | POA: Diagnosis not present

## 2017-08-06 DIAGNOSIS — L239 Allergic contact dermatitis, unspecified cause: Secondary | ICD-10-CM | POA: Diagnosis not present

## 2017-08-08 DIAGNOSIS — L239 Allergic contact dermatitis, unspecified cause: Secondary | ICD-10-CM | POA: Diagnosis not present

## 2017-08-13 DIAGNOSIS — L239 Allergic contact dermatitis, unspecified cause: Secondary | ICD-10-CM | POA: Diagnosis not present

## 2017-08-23 ENCOUNTER — Ambulatory Visit
Admission: RE | Admit: 2017-08-23 | Discharge: 2017-08-23 | Disposition: A | Discharge status: Home / Self Care | Payer: 59 | Source: Ambulatory Visit | Attending: Obstetrics and Gynecology | Admitting: Obstetrics and Gynecology

## 2017-08-23 DIAGNOSIS — Z1231 Encounter for screening mammogram for malignant neoplasm of breast: Secondary | ICD-10-CM | POA: Insufficient documentation

## 2018-01-07 DIAGNOSIS — R509 Fever, unspecified: Secondary | ICD-10-CM | POA: Diagnosis not present

## 2018-01-07 DIAGNOSIS — J029 Acute pharyngitis, unspecified: Secondary | ICD-10-CM | POA: Diagnosis not present

## 2018-01-07 DIAGNOSIS — Z20828 Contact with and (suspected) exposure to other viral communicable diseases: Secondary | ICD-10-CM | POA: Diagnosis not present

## 2018-01-28 DIAGNOSIS — J40 Bronchitis, not specified as acute or chronic: Secondary | ICD-10-CM | POA: Diagnosis not present

## 2018-02-15 DIAGNOSIS — Z0000 Encounter for general adult medical examination without abnormal findings: Secondary | ICD-10-CM | POA: Diagnosis not present

## 2018-02-18 DIAGNOSIS — Z0000 Encounter for general adult medical examination without abnormal findings: Secondary | ICD-10-CM | POA: Diagnosis not present

## 2018-03-19 DIAGNOSIS — R748 Abnormal levels of other serum enzymes: Secondary | ICD-10-CM | POA: Diagnosis not present

## 2018-05-13 DIAGNOSIS — Z8371 Family history of colonic polyps: Secondary | ICD-10-CM | POA: Diagnosis not present

## 2018-05-13 DIAGNOSIS — Z01818 Encounter for other preprocedural examination: Secondary | ICD-10-CM | POA: Diagnosis not present

## 2018-05-13 DIAGNOSIS — Z1211 Encounter for screening for malignant neoplasm of colon: Secondary | ICD-10-CM | POA: Diagnosis not present

## 2018-07-23 ENCOUNTER — Other Ambulatory Visit: Payer: Self-pay | Admitting: Obstetrics and Gynecology

## 2018-07-23 DIAGNOSIS — Z1231 Encounter for screening mammogram for malignant neoplasm of breast: Secondary | ICD-10-CM | POA: Diagnosis not present

## 2018-07-23 DIAGNOSIS — Z01419 Encounter for gynecological examination (general) (routine) without abnormal findings: Secondary | ICD-10-CM | POA: Diagnosis not present

## 2018-07-25 DIAGNOSIS — Z23 Encounter for immunization: Secondary | ICD-10-CM | POA: Diagnosis not present

## 2018-08-12 DIAGNOSIS — D485 Neoplasm of uncertain behavior of skin: Secondary | ICD-10-CM | POA: Diagnosis not present

## 2018-08-12 DIAGNOSIS — Z85828 Personal history of other malignant neoplasm of skin: Secondary | ICD-10-CM | POA: Diagnosis not present

## 2018-08-12 DIAGNOSIS — Z1283 Encounter for screening for malignant neoplasm of skin: Secondary | ICD-10-CM | POA: Diagnosis not present

## 2018-08-26 ENCOUNTER — Ambulatory Visit
Admission: RE | Admit: 2018-08-26 | Discharge: 2018-08-26 | Disposition: A | Discharge status: Home / Self Care | Payer: 59 | Source: Ambulatory Visit | Attending: Obstetrics and Gynecology | Admitting: Obstetrics and Gynecology

## 2018-08-26 DIAGNOSIS — Z1231 Encounter for screening mammogram for malignant neoplasm of breast: Secondary | ICD-10-CM

## 2018-09-02 ENCOUNTER — Ambulatory Visit
Admission: RE | Admit: 2018-09-02 | Discharge: 2018-09-02 | Disposition: A | Discharge status: Home / Self Care | Payer: 59 | Source: Ambulatory Visit | Attending: Unknown Physician Specialty | Admitting: Unknown Physician Specialty

## 2018-09-02 ENCOUNTER — Encounter
Admission: RE | Disposition: A | Discharge status: Home / Self Care | Payer: Self-pay | Source: Ambulatory Visit | Attending: Unknown Physician Specialty

## 2018-09-02 ENCOUNTER — Ambulatory Visit: Payer: 59 | Admitting: Anesthesiology

## 2018-09-02 ENCOUNTER — Encounter: Payer: Self-pay | Admitting: *Deleted

## 2018-09-02 DIAGNOSIS — D122 Benign neoplasm of ascending colon: Secondary | ICD-10-CM | POA: Insufficient documentation

## 2018-09-02 DIAGNOSIS — I10 Essential (primary) hypertension: Secondary | ICD-10-CM | POA: Insufficient documentation

## 2018-09-02 DIAGNOSIS — D123 Benign neoplasm of transverse colon: Secondary | ICD-10-CM | POA: Diagnosis not present

## 2018-09-02 DIAGNOSIS — G4730 Sleep apnea, unspecified: Secondary | ICD-10-CM | POA: Insufficient documentation

## 2018-09-02 DIAGNOSIS — D128 Benign neoplasm of rectum: Secondary | ICD-10-CM | POA: Diagnosis not present

## 2018-09-02 DIAGNOSIS — Z6841 Body Mass Index (BMI) 40.0 and over, adult: Secondary | ICD-10-CM | POA: Diagnosis not present

## 2018-09-02 DIAGNOSIS — Z1211 Encounter for screening for malignant neoplasm of colon: Secondary | ICD-10-CM | POA: Insufficient documentation

## 2018-09-02 DIAGNOSIS — K635 Polyp of colon: Secondary | ICD-10-CM | POA: Insufficient documentation

## 2018-09-02 DIAGNOSIS — Z791 Long term (current) use of non-steroidal anti-inflammatories (NSAID): Secondary | ICD-10-CM | POA: Insufficient documentation

## 2018-09-02 DIAGNOSIS — Z79891 Long term (current) use of opiate analgesic: Secondary | ICD-10-CM | POA: Insufficient documentation

## 2018-09-02 DIAGNOSIS — K219 Gastro-esophageal reflux disease without esophagitis: Secondary | ICD-10-CM | POA: Insufficient documentation

## 2018-09-02 DIAGNOSIS — Z87891 Personal history of nicotine dependence: Secondary | ICD-10-CM | POA: Diagnosis not present

## 2018-09-02 DIAGNOSIS — Z79899 Other long term (current) drug therapy: Secondary | ICD-10-CM | POA: Diagnosis not present

## 2018-09-02 DIAGNOSIS — Z85828 Personal history of other malignant neoplasm of skin: Secondary | ICD-10-CM | POA: Diagnosis not present

## 2018-09-02 DIAGNOSIS — Z8371 Family history of colonic polyps: Secondary | ICD-10-CM | POA: Insufficient documentation

## 2018-09-02 DIAGNOSIS — D126 Benign neoplasm of colon, unspecified: Secondary | ICD-10-CM | POA: Diagnosis not present

## 2018-09-02 DIAGNOSIS — K621 Rectal polyp: Secondary | ICD-10-CM | POA: Diagnosis not present

## 2018-09-02 HISTORY — PX: COLONOSCOPY WITH PROPOFOL: SHX5780

## 2018-09-02 SURGERY — COLONOSCOPY WITH PROPOFOL
Anesthesia: General

## 2018-09-02 MED ORDER — PROPOFOL 500 MG/50ML IV EMUL
INTRAVENOUS | Status: DC | PRN
  Administered 2018-09-02: 140 ug/kg/min via INTRAVENOUS

## 2018-09-02 MED ORDER — MIDAZOLAM HCL 2 MG/2ML IJ SOLN
INTRAMUSCULAR | Status: AC
  Filled 2018-09-02: qty 2

## 2018-09-02 MED ORDER — FENTANYL CITRATE (PF) 100 MCG/2ML IJ SOLN
INTRAMUSCULAR | Status: DC | PRN
  Administered 2018-09-02 (×2): 50 ug via INTRAVENOUS

## 2018-09-02 MED ORDER — SODIUM CHLORIDE 0.9 % IV SOLN: INTRAVENOUS | Status: DC

## 2018-09-02 MED ORDER — PROPOFOL 500 MG/50ML IV EMUL
INTRAVENOUS | Status: AC
  Filled 2018-09-02: qty 50

## 2018-09-02 MED ORDER — FENTANYL CITRATE (PF) 100 MCG/2ML IJ SOLN
INTRAMUSCULAR | Status: AC
  Filled 2018-09-02: qty 2

## 2018-09-02 MED ORDER — MIDAZOLAM HCL 2 MG/2ML IJ SOLN
INTRAMUSCULAR | Status: DC | PRN
  Administered 2018-09-02: 2 mg via INTRAVENOUS

## 2018-09-02 MED ORDER — SODIUM CHLORIDE 0.9 % IV SOLN
INTRAVENOUS | Status: DC
  Administered 2018-09-02: 11:00:00 via INTRAVENOUS

## 2018-09-02 NOTE — Transfer of Care (Signed)
Immediate Anesthesia Transfer of Care Note  Patient: Pamela Lee  Procedure(s) Performed: COLONOSCOPY WITH PROPOFOL (N/A )  Patient Location: PACU  Anesthesia Type:General  Level of Consciousness: awake and sedated  Airway & Oxygen Therapy: Patient Spontanous Breathing and Patient connected to nasal cannula oxygen  Post-op Assessment: Report given to RN and Post -op Vital signs reviewed and stable  Post vital signs: Reviewed and stable  Last Vitals:  Vitals Value Taken Time  BP    Temp    Pulse    Resp    SpO2      Last Pain:  Vitals:   09/02/18 1016  TempSrc: Tympanic         Complications: No apparent anesthesia complications

## 2018-09-02 NOTE — Op Note (Signed)
[facility] Patient Name: Pamela Lee Procedure Date: 09/02/2018 10:55 AM MRN: [id number] Account #: [id number] Date of Birth: 1975/11/23 Admit Type: Outpatient Age: 42 Room: [facility] ENDO ROOM 1 Gender: Female Note Status: Finalized Procedure:            Colonoscopy Indications:          Screening for colorectal malignant neoplasm Providers:            Manya Silvas, MD Referring MD:         No Local Md, MD (Referring MD) Medicines:            Propofol per Anesthesia Complications:        No immediate complications. Procedure:            Pre-Anesthesia Assessment:                       - After reviewing the risks and benefits, the patient                        was deemed in satisfactory condition to undergo the                        procedure.                       After obtaining informed consent, the colonoscope was                        passed under direct vision. Throughout the procedure,                        the patient's blood pressure, pulse, and oxygen                        saturations were monitored continuously. The                        Colonoscope was introduced through the anus and                        advanced to the the cecum, identified by appendiceal                        orifice and ileocecal valve. The colonoscopy was                        performed without difficulty. The patient tolerated the                        procedure well. The quality of the bowel preparation                        was excellent. Findings:      Three sessile polyps were found in the transverse colon. The polyps were       diminutive in size. These polyps were removed with a jumbo cold forceps.       Resection and retrieval were complete.      A diminutive polyp was found in the ascending colon. The polyp was       sessile. The polyp was removed with a cold snare. Resection and  retrieval were complete.      Two sessile polyps were  found in the ascending colon. The polyps were       diminutive in size. These polyps were removed with a jumbo cold forceps.       Resection and retrieval were complete.      A diminutive polyp was found in the transverse colon. The polyp was       sessile. The polyp was removed with a jumbo cold forceps. Resection and       retrieval were complete. Three of these were done.      A diminutive polyp was found in the rectum. The polyp was sessile. The       polyp was removed with a jumbo cold forceps. Resection and retrieval       were complete. Very small amt of blood seen after polypectomy. Impression:           - Three diminutive polyps in the transverse colon,                        removed with a jumbo cold forceps. Resected and                        retrieved.                       - One diminutive polyp in the ascending colon, removed                        with a cold snare. Resected and retrieved.                       - Two diminutive polyps in the ascending colon, removed                        with a jumbo cold forceps. Resected and retrieved.                       - One diminutive polyp in the transverse colon, removed                        with a jumbo cold forceps. Resected and retrieved.                       - One diminutive polyp in the rectum, removed with a                        jumbo cold forceps. Resected and retrieved. Recommendation:       - Await pathology results. Manya Silvas, MD 09/02/2018 11:38:53 AM This report has been signed electronically. Number of Addenda: 0 Note Initiated On: 09/02/2018 10:55 AM Scope Withdrawal Time: 0 hours 15 minutes 39 seconds  Total Procedure Duration: 0 hours 29 minutes 33 seconds       [facility]

## 2018-09-02 NOTE — Anesthesia Procedure Notes (Signed)
Performed by: Cook-Martin, Terik Haughey °Pre-anesthesia Checklist: Patient identified, Emergency Drugs available, Suction available, Patient being monitored and Timeout performed °Patient Re-evaluated:Patient Re-evaluated prior to induction °Oxygen Delivery Method: Simple face mask °Preoxygenation: Pre-oxygenation with 100% oxygen °Induction Type: IV induction °Placement Confirmation: positive ETCO2 and CO2 detector ° ° ° ° ° ° °

## 2018-09-02 NOTE — Anesthesia Preprocedure Evaluation (Signed)
Anesthesia Evaluation  Patient identified by MRN, date of birth, ID band Patient awake    Reviewed: Allergy & Precautions, NPO status , Patient's Chart, lab work & pertinent test results  History of Anesthesia Complications (+) AWARENESS UNDER ANESTHESIA and history of anesthetic complications (? awareness with last colonsocopy)  Airway Mallampati: III       Dental   Pulmonary sleep apnea (not using CPAP) , neg COPD, former smoker,           Cardiovascular hypertension, Pt. on medications (-) Past MI and (-) CHF (-) dysrhythmias (-) Valvular Problems/Murmurs     Neuro/Psych neg Seizures    GI/Hepatic Neg liver ROS, GERD  Medicated and Controlled,  Endo/Other  neg diabetesMorbid obesity  Renal/GU negative Renal ROS     Musculoskeletal   Abdominal   Peds  Hematology   Anesthesia Other Findings   Reproductive/Obstetrics                             Anesthesia Physical Anesthesia Plan  ASA: III  Anesthesia Plan: General   Post-op Pain Management:    Induction:   PONV Risk Score and Plan: 3 and Propofol infusion, TIVA and Midazolam  Airway Management Planned: Nasal Cannula  Additional Equipment:   Intra-op Plan:   Post-operative Plan:   Informed Consent: I have reviewed the patients History and Physical, chart, labs and discussed the procedure including the risks, benefits and alternatives for the proposed anesthesia with the patient or authorized representative who has indicated his/her understanding and acceptance.     Plan Discussed with:   Anesthesia Plan Comments:         Anesthesia Quick Evaluation

## 2018-09-02 NOTE — Anesthesia Postprocedure Evaluation (Signed)
Anesthesia Post Note  Patient: Pamela Lee  Procedure(s) Performed: COLONOSCOPY WITH PROPOFOL (N/A )  Patient location during evaluation: Endoscopy Anesthesia Type: General Level of consciousness: awake and alert Pain management: pain level controlled Vital Signs Assessment: post-procedure vital signs reviewed and stable Respiratory status: spontaneous breathing and respiratory function stable Cardiovascular status: stable Anesthetic complications: no     Last Vitals:  Vitals:   09/02/18 1147 09/02/18 1207  BP: 121/77 128/77  Pulse: 87   Resp: 19   Temp:    SpO2: 96%     Last Pain:  Vitals:   09/02/18 1207  TempSrc:   PainSc: 0-No pain                 KEPHART,WILLIAM K

## 2018-09-02 NOTE — Anesthesia Post-op Follow-up Note (Signed)
Anesthesia QCDR form completed.        

## 2018-09-02 NOTE — H&P (Signed)
Primary Care Physician:  Donnamarie Rossetti, PA-C Primary Gastroenterologist:  Dr. Vira Agar  Pre-Procedure History & Physical: HPI:  AERICA Lee is a 42 y.o. female is here for an colonoscopy.   Past Medical History:  Diagnosis Date  . Cancer ([place])    basal cell  . GERD (gastroesophageal reflux disease)   . Hypertension   . Mitral valve prolapse   . Sleep apnea     Past Surgical History:  Procedure Laterality Date  . ABDOMINAL HYSTERECTOMY  04/2015  . CESAREAN SECTION    . CYSTOSCOPY N/A 05/03/2015   Procedure: CYSTOSCOPY;  Surgeon: Benjaman Kindler, MD;  Location: [facility] ORS;  Service: Gynecology;  Laterality: N/A;  . NOVASURE ABLATION    . ROBOTIC ASSISTED TOTAL HYSTERECTOMY N/A 05/03/2015   Procedure: ROBOTIC ASSISTED TOTAL HYSTERECTOMY/BIL.SALPINGECTOMY;  Surgeon: Benjaman Kindler, MD;  Location: [facility] ORS;  Service: Gynecology;  Laterality: N/A;  . TUBAL LIGATION      Prior to Admission medications   Medication Sig Start Date End Date Taking? Authorizing Provider  acetaminophen (TYLENOL) 500 MG tablet Take 1,000 mg by mouth every 6 (six) hours as needed for mild pain or headache.    Yes [provider]  cetirizine (ZYRTEC) 10 MG tablet Take 10 mg by mouth daily as needed for allergies.   Yes [provider]  hydrochlorothiazide (HYDRODIURIL) 25 MG tablet Take 25 mg by mouth daily.   Yes [provider]  ibuprofen (ADVIL,MOTRIN) 600 MG tablet Take 1 tablet (600 mg total) by mouth every 6 (six) hours as needed for fever or headache. 05/04/15  Yes Benjaman Kindler, MD  docusate sodium (COLACE) 100 MG capsule Take 1 capsule (100 mg total) by mouth daily. 05/04/15   Benjaman Kindler, MD  fexofenadine (ALLEGRA) 180 MG tablet Take 180 mg by mouth daily.    [provider]  omeprazole (PRILOSEC) 20 MG capsule Take 20 mg by mouth 2 (two) times daily before a meal. 30-45 minutes before meal    [provider]  oxyCODONE-acetaminophen  (PERCOCET/ROXICET) 5-325 MG per tablet Take 1 tablet by mouth every 4 (four) hours as needed for moderate pain. 05/04/15   Benjaman Kindler, MD  potassium chloride SA (K-DUR,KLOR-CON) 20 MEQ tablet Take 2 tablets (40 mEq total) by mouth once. 05/04/15   Benjaman Kindler, MD    Allergies as of 05/31/2018  . (No Known Allergies)    Family History  Problem Relation Age of Onset  . Hypertension Mother   . Diabetes Father   . Hypertension Father   . Breast cancer Neg Hx     Social History   Socioeconomic History  . Marital status: Married    Spouse name: Not on file  . Number of children: Not on file  . Years of education: Not on file  . Highest education level: Not on file  Occupational History  . Not on file  Social Needs  . Financial resource strain: Not on file  . Food insecurity:    Worry: Not on file    Inability: Not on file  . Transportation needs:    Medical: Not on file    Non-medical: Not on file  Tobacco Use  . Smoking status: Former Smoker    Packs/day: 0.25    Types: Cigarettes    Last attempt to quit: 04/22/2006    Years since quitting: 12.3  . Smokeless tobacco: Never Used  Substance and Sexual Activity  . Alcohol use: No  . Drug use: No  .  Sexual activity: Not on file  Lifestyle  . Physical activity:    Days per week: Not on file    Minutes per session: Not on file  . Stress: Not on file  Relationships  . Social connections:    Talks on phone: Not on file    Gets together: Not on file    Attends religious service: Not on file    Active member of club or organization: Not on file    Attends meetings of clubs or organizations: Not on file    Relationship status: Not on file  . Intimate partner violence:    Fear of current or ex partner: Not on file    Emotionally abused: Not on file    Physically abused: Not on file    Forced sexual activity: Not on file  Other Topics Concern  . Not on file  Social History Narrative  . Not on file    Review  of Systems: See HPI, otherwise negative ROS  Physical Exam: BP (!) 154/103   Pulse 93   Temp (!) 96.8 F (36 C) (Tympanic)   Resp 20   Ht 5\' 2"  (1.575 m)   Wt 117.9 kg   LMP  (LMP Unknown)   SpO2 100%   BMI 47.55 kg/m  General:   Alert,  pleasant and cooperative in NAD Head:  Normocephalic and atraumatic. Neck:  Supple; no masses or thyromegaly. Lungs:  Clear throughout to auscultation.    Heart:  Regular rate and rhythm. Abdomen:  Soft, nontender and nondistended. Normal bowel sounds, without guarding, and without rebound.   Neurologic:  Alert and  oriented x4;  grossly normal neurologically.  Impression/Plan: Pamela Lee is here for an colonoscopy to be performed for colon screening and FH colon polyps in mother and sister.  Last colon 11/10 2014.  Risks, benefits, limitations, and alternatives regarding  colonoscopy have been reviewed with the patient.  Questions have been answered.  All parties agreeable.   Gaylyn Cheers, MD  09/02/2018, 10:54 AM

## 2018-09-03 ENCOUNTER — Encounter: Payer: Self-pay | Admitting: Unknown Physician Specialty

## 2018-09-03 LAB — SURGICAL PATHOLOGY

## 2018-09-12 DIAGNOSIS — L570 Actinic keratosis: Secondary | ICD-10-CM | POA: Diagnosis not present

## 2018-11-07 DIAGNOSIS — L570 Actinic keratosis: Secondary | ICD-10-CM | POA: Diagnosis not present

## 2018-11-07 DIAGNOSIS — L578 Other skin changes due to chronic exposure to nonionizing radiation: Secondary | ICD-10-CM | POA: Diagnosis not present

## 2018-11-07 DIAGNOSIS — L821 Other seborrheic keratosis: Secondary | ICD-10-CM | POA: Diagnosis not present

## 2018-11-07 DIAGNOSIS — L739 Follicular disorder, unspecified: Secondary | ICD-10-CM | POA: Diagnosis not present

## 2019-08-08 ENCOUNTER — Other Ambulatory Visit: Payer: Self-pay | Admitting: Obstetrics and Gynecology

## 2019-08-08 DIAGNOSIS — Z1231 Encounter for screening mammogram for malignant neoplasm of breast: Secondary | ICD-10-CM

## 2019-09-11 ENCOUNTER — Ambulatory Visit
Admission: RE | Admit: 2019-09-11 | Discharge: 2019-09-11 | Disposition: A | Discharge status: Home / Self Care | Payer: 59 | Source: Ambulatory Visit | Attending: Obstetrics and Gynecology | Admitting: Obstetrics and Gynecology

## 2019-09-11 ENCOUNTER — Other Ambulatory Visit: Payer: Self-pay

## 2019-09-11 ENCOUNTER — Encounter (INDEPENDENT_AMBULATORY_CARE_PROVIDER_SITE_OTHER): Payer: Self-pay

## 2019-09-11 DIAGNOSIS — Z1231 Encounter for screening mammogram for malignant neoplasm of breast: Secondary | ICD-10-CM | POA: Insufficient documentation

## 2020-08-30 ENCOUNTER — Other Ambulatory Visit: Payer: Self-pay

## 2020-08-30 ENCOUNTER — Encounter: Payer: Self-pay | Admitting: Dermatology

## 2020-08-30 ENCOUNTER — Ambulatory Visit (INDEPENDENT_AMBULATORY_CARE_PROVIDER_SITE_OTHER): Payer: 59 | Admitting: Dermatology

## 2020-08-30 DIAGNOSIS — Z1283 Encounter for screening for malignant neoplasm of skin: Secondary | ICD-10-CM

## 2020-08-30 DIAGNOSIS — D229 Melanocytic nevi, unspecified: Secondary | ICD-10-CM

## 2020-08-30 DIAGNOSIS — L820 Inflamed seborrheic keratosis: Secondary | ICD-10-CM

## 2020-08-30 DIAGNOSIS — Z85828 Personal history of other malignant neoplasm of skin: Secondary | ICD-10-CM

## 2020-08-30 DIAGNOSIS — L719 Rosacea, unspecified: Secondary | ICD-10-CM

## 2020-08-30 DIAGNOSIS — L814 Other melanin hyperpigmentation: Secondary | ICD-10-CM

## 2020-08-30 DIAGNOSIS — Z86018 Personal history of other benign neoplasm: Secondary | ICD-10-CM | POA: Diagnosis not present

## 2020-08-30 DIAGNOSIS — L821 Other seborrheic keratosis: Secondary | ICD-10-CM

## 2020-08-30 DIAGNOSIS — L918 Other hypertrophic disorders of the skin: Secondary | ICD-10-CM

## 2020-08-30 DIAGNOSIS — L738 Other specified follicular disorders: Secondary | ICD-10-CM

## 2020-08-30 DIAGNOSIS — L905 Scar conditions and fibrosis of skin: Secondary | ICD-10-CM

## 2020-08-30 DIAGNOSIS — L818 Other specified disorders of pigmentation: Secondary | ICD-10-CM

## 2020-08-30 DIAGNOSIS — D1800 Hemangioma unspecified site: Secondary | ICD-10-CM

## 2020-08-30 DIAGNOSIS — L578 Other skin changes due to chronic exposure to nonionizing radiation: Secondary | ICD-10-CM

## 2020-08-30 NOTE — Progress Notes (Signed)
Follow-Up Visit   Subjective  Pamela Lee is a 44 y.o. female who presents for the following: Annual Exam (History of BCC and Dysplastic nevus - TBSE today). The patient presents for Total-Body Skin Exam (TBSE) for skin cancer screening and mole check.  The following portions of the chart were reviewed this encounter and updated as appropriate:  Tobacco  Allergies  Meds  Problems  Med Hx  Surg Hx  Fam Hx     Review of Systems:  No other skin or systemic complaints except as noted in HPI or Assessment and Plan.  Objective  Well appearing patient in no apparent distress; mood and affect are within normal limits.  A full examination was performed including scalp, head, eyes, ears, nose, lips, neck, chest, axillae, abdomen, back, buttocks, bilateral upper extremities, bilateral lower extremities, hands, feet, fingers, toes, fingernails, and toenails. All findings within normal limits unless otherwise noted below.  Objective  Face: Pinkness and small papules.  Objective  Right Forehead: Well healed scar with no evidence of recurrence.   Objective  Right Knee: Scar with no evidence of recurrence.   Objective  Face: Yellow papules.  Objective  Back x 3, right temple x 1, right zygoma x 2 (6): Erythematous keratotic or waxy stuck-on papule or plaque.    Assessment & Plan    Acrochordons (Skin Tags) - Fleshy, skin-colored pedunculated papules - Benign appearing.  - Observe. - If desired, they can be removed with an in office procedure that is not covered by insurance. - Please call the clinic if you notice any new or changing lesions.  Lentigines - Scattered tan macules - Discussed due to sun exposure - Benign, observe - Call for any changes  Seborrheic Keratoses - Stuck-on, waxy, tan-brown papules and plaques  - Discussed benign etiology and prognosis. - Observe - Call for any changes  Melanocytic Nevi - Tan-brown and/or pink-flesh-colored symmetric  macules and papules - Benign appearing on exam today - Observation - Call clinic for new or changing moles - Recommend daily use of broad spectrum spf 30+ sunscreen to sun-exposed areas.   Hemangiomas - Red papules - Discussed benign nature - Observe - Call for any changes  Actinic Damage - Chronic, secondary to cumulative UV/sun exposure - diffuse scaly erythematous macules with underlying dyspigmentation - Recommend daily broad spectrum sunscreen SPF 30+ to sun-exposed areas, reapply every 2 hours as needed.  - Call for new or changing lesions.  Skin cancer screening performed today.  Graphite Tattoo of right forehead Benign-appearing.  Observation.  Call clinic for new or changing lesions.  Recommend daily use of broad spectrum spf 30+ sunscreen to sun-exposed areas.   Traumatic scar of left forehead For years. Benign-appearing.  Observation.  Call clinic for new or changing lesions.  Recommend daily use of broad spectrum spf 30+ sunscreen to sun-exposed areas.   Rosacea Face Rosacea is a chronic progressive skin condition usually affecting the face of adults. It is treatable but not curable. It sometimes affects the eyes (ocular rosacea) as well. It may respond to topical and/or systemic medication and can flare with stress, sun exposure, alcohol, exercise and some foods.  Start [facility] metronidazole/ivermectin/azelaic acid twice daily as needed to affected areas on the face. The patient was advised this is not covered by insurance. They will receive an email to check out and the medication will be mailed to their home.    History of basal cell carcinoma (BCC) Right Forehead Clear. Observe for recurrence.  Call clinic for new or changing lesions.  Recommend regular skin exams, daily broad-spectrum spf 30+ sunscreen use, and photoprotection.    History of dysplastic nevus Right Knee Clear. Observe for recurrence. Call clinic for new or changing lesions.  Recommend  regular skin exams, daily broad-spectrum spf 30+ sunscreen use, and photoprotection.    Sebaceous hyperplasia Face Benign.  Inflamed seborrheic keratosis (6) Back x 3, right temple x 1, right zygoma x 2  Destruction of lesion - Back x 3, right temple x 1, right zygoma x 2 Complexity: simple   Destruction method: cryotherapy   Informed consent: discussed and consent obtained   Timeout:  patient name, date of birth, surgical site, and procedure verified Lesion destroyed using liquid nitrogen: Yes   Region frozen until ice ball extended beyond lesion: Yes   Outcome: patient tolerated procedure well with no complications   Post-procedure details: wound care instructions given    Return in about 1 year (around 08/30/2021) for TBSE.  I, Ashok Cordia, CMA, am acting as scribe for Sarina Ser, MD .  Documentation: I have reviewed the above documentation for accuracy and completeness, and I agree with the above.  Sarina Ser, MD

## 2020-08-30 NOTE — Patient Instructions (Signed)
Instructions for [facility] Medications  One or more of your medications was sent to the [facility] mail order compounding pharmacy. You will receive an email from them and can purchase the medicine through that link. It will then be mailed to your home at the address you confirmed. If for any reason you do not receive an email from them, please check your spam folder. If you still do not find the email, please let us know. [facility] phone number is [phone].  Wound Care Instructions  1. Cleanse wound gently with soap and water once a day then pat dry with clean gauze. Apply a thing coat of Petrolatum (petroleum jelly, "Vaseline") over the wound (unless you have an allergy to this). We recommend that you use a new, sterile tube of Vaseline. Do not pick or remove scabs. Do not remove the yellow or white "healing tissue" from the base of the wound.  2. Cover the wound with fresh, clean, nonstick gauze and secure with paper tape. You may use Band-Aids in place of gauze and tape if the would is small enough, but would recommend trimming much of the tape off as there is often too much. Sometimes Band-Aids can irritate the skin.  3. You should call the office for your biopsy report after 1 week if you have not already been contacted.  4. If you experience any problems, such as abnormal amounts of bleeding, swelling, significant bruising, significant pain, or evidence of infection, please call the office immediately.  5. FOR ADULT SURGERY PATIENTS: If you need something for pain relief you may take 1 extra strength Tylenol (acetaminophen) AND 2 Ibuprofen (200mg  each) together every 4 hours as needed for pain. (do not take these if you are allergic to them or if you have a reason you should not take them.) Typically, you may only need pain medication for 1 to 3 days.

## 2020-09-21 ENCOUNTER — Other Ambulatory Visit: Payer: Self-pay

## 2020-09-21 ENCOUNTER — Ambulatory Visit
Admission: RE | Admit: 2020-09-21 | Discharge: 2020-09-21 | Disposition: A | Discharge status: Home / Self Care | Payer: 59 | Source: Ambulatory Visit | Attending: Obstetrics and Gynecology | Admitting: Obstetrics and Gynecology

## 2020-09-21 ENCOUNTER — Other Ambulatory Visit: Payer: Self-pay | Admitting: Obstetrics and Gynecology

## 2020-09-21 DIAGNOSIS — Z1231 Encounter for screening mammogram for malignant neoplasm of breast: Secondary | ICD-10-CM | POA: Insufficient documentation

## 2021-01-20 ENCOUNTER — Other Ambulatory Visit: Payer: Self-pay

## 2021-01-20 ENCOUNTER — Emergency Department: Payer: 59

## 2021-01-20 ENCOUNTER — Emergency Department
Admission: EM | Admit: 2021-01-20 | Discharge: 2021-01-20 | Disposition: A | Discharge status: Home / Self Care | Payer: 59 | Attending: Emergency Medicine | Admitting: Emergency Medicine

## 2021-01-20 DIAGNOSIS — I10 Essential (primary) hypertension: Secondary | ICD-10-CM | POA: Diagnosis not present

## 2021-01-20 DIAGNOSIS — R079 Chest pain, unspecified: Secondary | ICD-10-CM

## 2021-01-20 DIAGNOSIS — Z79899 Other long term (current) drug therapy: Secondary | ICD-10-CM | POA: Diagnosis not present

## 2021-01-20 DIAGNOSIS — Z85828 Personal history of other malignant neoplasm of skin: Secondary | ICD-10-CM | POA: Diagnosis not present

## 2021-01-20 DIAGNOSIS — K2100 Gastro-esophageal reflux disease with esophagitis, without bleeding: Secondary | ICD-10-CM

## 2021-01-20 DIAGNOSIS — R0789 Other chest pain: Secondary | ICD-10-CM | POA: Diagnosis present

## 2021-01-20 DIAGNOSIS — Z87891 Personal history of nicotine dependence: Secondary | ICD-10-CM | POA: Diagnosis not present

## 2021-01-20 LAB — BASIC METABOLIC PANEL
Anion gap: 11 (ref 5–15)
BUN: 14 mg/dL (ref 6–20)
CO2: 29 mmol/L (ref 22–32)
Calcium: 9.7 mg/dL (ref 8.9–10.3)
Chloride: 100 mmol/L (ref 98–111)
Creatinine, Ser: 0.83 mg/dL (ref 0.44–1.00)
GFR, Estimated: >60 mL/min (ref >60)
Glucose, Bld: 114 mg/dL — ABNORMAL HIGH (ref 70–99)
Potassium: 2.9 mmol/L — ABNORMAL LOW (ref 3.5–5.1)
Sodium: 140 mmol/L (ref 135–145)

## 2021-01-20 LAB — CBC
HCT: 43.6 % (ref 36.0–46.0)
Hemoglobin: 15.1 g/dL — ABNORMAL HIGH (ref 12.0–15.0)
MCH: 28.9 pg (ref 26.0–34.0)
MCHC: 34.6 g/dL (ref 30.0–36.0)
MCV: 83.5 fL (ref 80.0–100.0)
Platelets: 403 10*3/uL — ABNORMAL HIGH (ref 150–400)
RBC: 5.22 MIL/uL — ABNORMAL HIGH (ref 3.87–5.11)
RDW: 12.5 % (ref 11.5–15.5)
WBC: 11.8 10*3/uL — ABNORMAL HIGH (ref 4.0–10.5)
nRBC: 0 % (ref 0.0–0.2)

## 2021-01-20 LAB — TROPONIN I (HIGH SENSITIVITY)
Troponin I (High Sensitivity): 4 ng/L (ref <18)
Troponin I (High Sensitivity): 4 ng/L (ref <18)

## 2021-01-20 LAB — D-DIMER, QUANTITATIVE: D-Dimer, Quant: 0.52 ug/mL-FEU — ABNORMAL HIGH (ref 0.00–0.50)

## 2021-01-20 MED ORDER — POTASSIUM CHLORIDE CRYS ER 20 MEQ PO TBCR
40.0000 meq | EXTENDED_RELEASE_TABLET | Freq: Once | ORAL | Status: AC
  Administered 2021-01-20: 40 meq via ORAL
  Filled 2021-01-20: qty 2

## 2021-01-20 MED ORDER — IOHEXOL 350 MG/ML SOLN
75.0000 mL | Freq: Once | INTRAVENOUS | Status: AC | PRN
  Administered 2021-01-20: 75 mL via INTRAVENOUS

## 2021-01-20 MED ORDER — POTASSIUM CHLORIDE 10 MEQ/100ML IV SOLN
10.0000 meq | INTRAVENOUS | Status: AC
  Administered 2021-01-20: 10 meq via INTRAVENOUS
  Filled 2021-01-20: qty 100

## 2021-01-20 MED ORDER — POTASSIUM CHLORIDE CRYS ER 20 MEQ PO TBCR
20.0000 meq | EXTENDED_RELEASE_TABLET | Freq: Once | ORAL | Status: AC
  Administered 2021-01-20: 20 meq via ORAL
  Filled 2021-01-20: qty 1

## 2021-01-20 NOTE — ED Provider Notes (Signed)
[facility] Emergency Department Provider Note   ____________________________________________   Event Date/Time   First MD Initiated Contact with Patient 01/20/21 1825     (approximate)  I have reviewed the triage vital signs and the nursing notes.   HISTORY  Chief Complaint Chest Pain    HPI Pamela Lee is a 45 y.o. female with past medical history of hypertension, mitral valve prolapse, and GERD who presents to the ED complaining of chest pain.  Patient reports that she has had constant pain in the center of her chest for the past 4 days.  She describes it as a tightness or pressure that seems to get worse when she takes a deep breath.  She has not had any fevers, cough, or shortness of breath.  She felt nauseous with 1 episode of vomiting earlier today, but nausea has since resolved.  She has not noticed any pain or swelling in her legs.  She reports family history of heart disease in her father, who had a bypass at age 64.        Past Medical History:  Diagnosis Date  . Basal cell carcinoma    right forehead (treated by Dr. Tyler Deis)  . Cancer ([place])    basal cell  . GERD (gastroesophageal reflux disease)   . Hx of dysplastic nevus 07/29/2015   R lat knee - severe atypia, excised 08/31/2015  . Hypertension   . Mitral valve prolapse   . Sleep apnea     Patient Active Problem List   Diagnosis Date Noted  . S/P hysterectomy 05/03/2015    Past Surgical History:  Procedure Laterality Date  . ABDOMINAL HYSTERECTOMY  04/2015  . CESAREAN SECTION    . COLONOSCOPY WITH PROPOFOL N/A 09/02/2018   Procedure: COLONOSCOPY WITH PROPOFOL;  Surgeon: Manya Silvas, MD;  Location: [facility] ENDOSCOPY;  Service: Endoscopy;  Laterality: N/A;  . CYSTOSCOPY N/A 05/03/2015   Procedure: CYSTOSCOPY;  Surgeon: Benjaman Kindler, MD;  Location: [facility] ORS;  Service: Gynecology;  Laterality: N/A;  . NOVASURE ABLATION    . ROBOTIC ASSISTED TOTAL HYSTERECTOMY N/A 05/03/2015    Procedure: ROBOTIC ASSISTED TOTAL HYSTERECTOMY/BIL.SALPINGECTOMY;  Surgeon: Benjaman Kindler, MD;  Location: [facility] ORS;  Service: Gynecology;  Laterality: N/A;  . TUBAL LIGATION      Prior to Admission medications   Medication Sig Start Date End Date Taking? Authorizing Provider  acetaminophen (TYLENOL) 500 MG tablet Take 1,000 mg by mouth every 6 (six) hours as needed for mild pain or headache.     [provider]  cetirizine (ZYRTEC) 10 MG tablet Take 10 mg by mouth daily as needed for allergies.    [provider]  docusate sodium (COLACE) 100 MG capsule Take 1 capsule (100 mg total) by mouth daily. 05/04/15   Benjaman Kindler, MD  fexofenadine (ALLEGRA) 180 MG tablet Take 180 mg by mouth daily.    [provider]  hydrochlorothiazide (HYDRODIURIL) 25 MG tablet Take 25 mg by mouth daily.    [provider]  ibuprofen (ADVIL,MOTRIN) 600 MG tablet Take 1 tablet (600 mg total) by mouth every 6 (six) hours as needed for fever or headache. 05/04/15   Benjaman Kindler, MD  omeprazole (PRILOSEC) 20 MG capsule Take 20 mg by mouth 2 (two) times daily before a meal. 30-45 minutes before meal    [provider]  oxyCODONE-acetaminophen (PERCOCET/ROXICET) 5-325 MG per tablet Take 1 tablet by mouth every 4 (four) hours as needed for moderate pain. 05/04/15  Benjaman Kindler, MD  potassium chloride SA (K-DUR,KLOR-CON) 20 MEQ tablet Take 2 tablets (40 mEq total) by mouth once. 05/04/15   Benjaman Kindler, MD    Allergies Lactose intolerance (gi)  Family History  Problem Relation Age of Onset  . Hypertension Mother   . Diabetes Father   . Hypertension Father   . Breast cancer Neg Hx     Social History Social History   Tobacco Use  . Smoking status: Former Smoker    Packs/day: 0.25    Types: Cigarettes    Quit date: 04/22/2006    Years since quitting: 14.7  . Smokeless tobacco: Never Used  Substance Use Topics  . Alcohol use: No  . Drug use: No     Review of Systems  Constitutional: No fever/chills Eyes: No visual changes. ENT: No sore throat. Cardiovascular: Positive for chest pain. Respiratory: Denies shortness of breath. Gastrointestinal: No abdominal pain.  Positive for nausea and vomiting.  No diarrhea.  No constipation. Genitourinary: Negative for dysuria. Musculoskeletal: Negative for back pain. Skin: Negative for rash. Neurological: Negative for headaches, focal weakness or numbness.  ____________________________________________   PHYSICAL EXAM:  VITAL SIGNS: ED Triage Vitals  Enc Vitals Group     BP 01/20/21 1811 (!) 146/95     Pulse Rate 01/20/21 1811 98     Resp 01/20/21 1811 18     Temp 01/20/21 1811 99.1 F (37.3 C)     Temp Source 01/20/21 1811 Oral     SpO2 01/20/21 1811 97 %     Weight 01/20/21 1806 275 lb (124.7 kg)     Height 01/20/21 1806 5\' 2"  (1.575 m)     Head Circumference --      Peak Flow --      Pain Score 01/20/21 1805 2     Pain Loc --      Pain Edu? --      Excl. in [place]? --     Constitutional: Alert and oriented. Eyes: Conjunctivae are normal. Head: Atraumatic. Nose: No congestion/rhinnorhea. Mouth/Throat: Mucous membranes are moist. Neck: Normal ROM Cardiovascular: Normal rate, regular rhythm. Grossly normal heart sounds.  2+ radial pulses bilaterally. Respiratory: Normal respiratory effort.  No retractions. Lungs CTAB.  No chest wall tenderness to palpation. Gastrointestinal: Soft and nontender. No distention. Genitourinary: deferred Musculoskeletal: No lower extremity tenderness nor edema. Neurologic:  Normal speech and language. No gross focal neurologic deficits are appreciated. Skin:  Skin is warm, dry and intact. No rash noted. Psychiatric: Mood and affect are normal. Speech and behavior are normal.  ____________________________________________   LABS (all labs ordered are listed, but only abnormal results are displayed)  Labs Reviewed  BASIC METABOLIC PANEL -  Abnormal; Notable for the following components:      Result Value   Potassium 2.9 (*)    Glucose, Bld 114 (*)    All other components within normal limits  CBC - Abnormal; Notable for the following components:   WBC 11.8 (*)    RBC 5.22 (*)    Hemoglobin 15.1 (*)    Platelets 403 (*)    All other components within normal limits  D-DIMER, QUANTITATIVE - Abnormal; Notable for the following components:   D-Dimer, Quant 0.52 (*)    All other components within normal limits  TROPONIN I (HIGH SENSITIVITY)  TROPONIN I (HIGH SENSITIVITY)   ____________________________________________  EKG  ED ECG REPORT I, Blake Divine, the attending physician, personally viewed and interpreted this ECG.   Date: 01/20/2021  EKG Time:  18:10  Rate: 100  Rhythm: sinus tachycardia  Axis: Normal  Intervals:none  ST&T Change: None   PROCEDURES  Procedure(s) performed (including Critical Care):  Procedures   ____________________________________________   INITIAL IMPRESSION / ASSESSMENT AND PLAN / ED COURSE       45 year old female with past medical history of hypertension, GERD, and mitral valve prolapse who presents to the ED with constant chest pressure and tightness for the past 4 days that seems to be worse with a deep breath.  EKG shows no evidence of arrhythmia or ischemia, chest x-ray reviewed by me and shows no infiltrate, edema, or effusion.  We will screen troponin as well as D-dimer.  If work-up is unremarkable, I suspect patient symptoms are related to GERD.  2 sets of troponin are negative, D-dimer was elevated but CTA of chest is negative for PE or other acute process.  Patient states chest pain has resolved at this time and she is appropriate for discharge home with PCP follow-up.  I suspect her symptoms are related to GERD, she was counseled to continue PPI and use Pepcid as needed.  She was counseled to return to the ED for new worsening symptoms, patient agrees with plan.       ____________________________________________   FINAL CLINICAL IMPRESSION(S) / ED DIAGNOSES  Final diagnoses:  Nonspecific chest pain  Gastroesophageal reflux disease with esophagitis without hemorrhage     ED Discharge Orders    None       Note:  This document was prepared using Dragon voice recognition software and may include unintentional dictation errors.   Blake Divine, MD 01/20/21 2155

## 2021-01-20 NOTE — ED Notes (Signed)
This RN agrees with triage assessment. Pt is A&Ox4.

## 2021-01-20 NOTE — ED Triage Notes (Signed)
Pt to ER from [facility] clinic with pressure/ heaviness to mid chest. Denies radiation. States she felt like she was having acid reflux since Saturday, then started having nausea and vomiting this morning. Reports mild shortness of breath but no more than usual.

## 2021-06-17 ENCOUNTER — Other Ambulatory Visit: Payer: Self-pay

## 2021-06-17 ENCOUNTER — Encounter: Payer: Self-pay | Admitting: Dietician

## 2021-06-17 ENCOUNTER — Encounter: Payer: 59 | Attending: Family Medicine | Admitting: Dietician

## 2021-06-17 VITALS — Ht 63.0 in | Wt 275.2 lb

## 2021-06-17 DIAGNOSIS — Z6841 Body Mass Index (BMI) 40.0 and over, adult: Secondary | ICD-10-CM | POA: Diagnosis not present

## 2021-06-17 NOTE — Patient Instructions (Signed)
Continue to work on controlling portions of foods; try using smaller plates, eat slowly and chew each bite of food thoroughly.  Resume some walking or other exercise, start with short time and increase as energy increases. Plan to have something to eat every 3-5 hours during the day. Include a small snack if there is more than 6 hours between meals. Yogurt, fruit and nuts or cheese or peanut butter, 1/3 cup trail mix, small fruit smoothie or protein drink can be healthy options. Try mixing small amounts of veggies with pasta/ rice/ potatoes to improve nutritional balance in meals.

## 2021-06-17 NOTE — Progress Notes (Signed)
Medical Nutrition Therapy: Visit start time: 0820  end time: 0930  Assessment:  Diagnosis: obesity Past medical history: pre-diabetes, HTN, sleep apnea, GERD Psychosocial issues/ stress concerns: none  Preferred learning method:  Auditory Visual Hands-on   Current weight: 275.2lbs Height: 5'3" BMI: 48.75 Medications, supplements: reconciled list in medical record  Progress and evaluation:  Patient reports personal goal weight of 180lbs, at least <200lbs. Reports struggle with weight since childhood; has tried multiple diets in the past. Erline Hau which worked until ending the program. Has also followed [employer] plan until drifting away from diet. Lost 80lbs with herbalife shakes 2x daily + 1 meals and 2 snacks. She has been making some diet changes, such as working on smaller food portions. Tries to keep some trigger foods out of the home ie chips esp pringles, ice cream Reports picky eating, does not like raw veg., melons and some other fruits and veg. Does not eat oatmeal (texture) She was increasing exercise by walking but then had leg injury and has not yet resumed.     Physical activity: sporadic walking, stationary bike Does not like many veg, or most seafood  Dietary Intake:  Usual eating pattern includes 3 meals and 0-1 snacks per day. Dining out frequency: 4-6 meals per week.  Breakfast: sausage and toast occ with boiled egg; fast food biscuit 1-2x a week Snack: none Lunch: sandwich with ham or [place] and cheese, with chips or macarioni salad Snack: none Supper: batch cooking on weekends ie chicken with rice/ noodles/ baked or other beans/ other veg, lasagna, steak with potato/ fries; spaghetti; tacos; pork carnitas Snack: often none; occasionally ice cream or fruit sorbet/ snack cake if dinner is early Beverages: coke zero/ Dr pepper zero, some water (states not enough)  Nutrition Care Education: Topics covered:  Basic nutrition: basic food groups,  appropriate nutrient balance, appropriate meal and snack schedule, general nutrition guidelines    Weight control: determining reasonable weight loss rate; importance of low sugar and low fat choices; portion control strategies including using smaller plates, eating slowly and chewing foods well; estimated energy needs at 1400kcal, provided guidance for 45% CHO, 25% pro, 30% fat; discussed role of physical activity and options for exercise; managing food cravings/ appetite  Nutritional Diagnosis:  [place]-3.3 Overweight/obesity As related to excess calories and inadequate physical activity.  As evidenced by patient with current BMI of 48.75, making diet and lifestyle changes to promote weight loss.  Intervention:  Instruction and discussion as noted above. Patient has been making positive dietary changes, and has notice slight weight loss. She is motivated to continue. Established additional goals for change with direction from patient.   Education Materials given:  Museum/gallery conservator with food lists, sample meal pattern Build a [employer] Tips for Managing Food Cravings Visit summary with goals/ instructions   Learner/ who was taught:  Patient   Level of understanding: Verbalizes/ demonstrates competency   Demonstrated degree of understanding via:   Teach back Learning barriers: None  Willingness to learn/ readiness for change: Eager, change in progress   Monitoring and Evaluation:  Dietary intake, exercise, and body weight      follow up:  08/05/21 at 8:15am

## 2021-08-05 ENCOUNTER — Ambulatory Visit: Payer: 59 | Admitting: Dietician

## 2021-08-18 ENCOUNTER — Encounter: Payer: Self-pay | Admitting: Dietician

## 2021-08-18 NOTE — Progress Notes (Signed)
Have not heard back from patient to reschedule her cancelled appointment from 08/05/21. Sent notification to referring provider.

## 2021-09-05 ENCOUNTER — Other Ambulatory Visit: Payer: Self-pay

## 2021-09-05 ENCOUNTER — Ambulatory Visit (INDEPENDENT_AMBULATORY_CARE_PROVIDER_SITE_OTHER): Payer: 59 | Admitting: Dermatology

## 2021-09-05 DIAGNOSIS — L719 Rosacea, unspecified: Secondary | ICD-10-CM

## 2021-09-05 DIAGNOSIS — D229 Melanocytic nevi, unspecified: Secondary | ICD-10-CM

## 2021-09-05 DIAGNOSIS — L818 Other specified disorders of pigmentation: Secondary | ICD-10-CM | POA: Diagnosis not present

## 2021-09-05 DIAGNOSIS — L578 Other skin changes due to chronic exposure to nonionizing radiation: Secondary | ICD-10-CM | POA: Diagnosis not present

## 2021-09-05 DIAGNOSIS — L820 Inflamed seborrheic keratosis: Secondary | ICD-10-CM | POA: Diagnosis not present

## 2021-09-05 DIAGNOSIS — D1800 Hemangioma unspecified site: Secondary | ICD-10-CM

## 2021-09-05 DIAGNOSIS — Z86018 Personal history of other benign neoplasm: Secondary | ICD-10-CM

## 2021-09-05 DIAGNOSIS — Z1283 Encounter for screening for malignant neoplasm of skin: Secondary | ICD-10-CM | POA: Diagnosis not present

## 2021-09-05 DIAGNOSIS — L814 Other melanin hyperpigmentation: Secondary | ICD-10-CM

## 2021-09-05 DIAGNOSIS — Z85828 Personal history of other malignant neoplasm of skin: Secondary | ICD-10-CM

## 2021-09-05 DIAGNOSIS — L821 Other seborrheic keratosis: Secondary | ICD-10-CM

## 2021-09-05 NOTE — Progress Notes (Signed)
Follow-Up Visit   Subjective  Pamela Lee is a 45 y.o. female who presents for the following: Follow-up (Patient reports here today for 1 year tbse. Patient has a few spots she would like checked on chest, left knee, left calf, and feet. ).  She has rosacea this is better controlled on topical treatment. Patient here for full body skin exam and skin cancer screening.  The following portions of the chart were reviewed this encounter and updated as appropriate:  Tobacco  Allergies  Meds  Problems  Med Hx  Surg Hx  Fam Hx     Review of Systems: No other skin or systemic complaints except as noted in HPI or Assessment and Plan.  Objective  Well appearing patient in no apparent distress; mood and affect are within normal limits.  A full examination was performed including scalp, head, eyes, ears, nose, lips, neck, chest, axillae, abdomen, back, buttocks, bilateral upper extremities, bilateral lower extremities, hands, feet, fingers, toes, fingernails, and toenails. All findings within normal limits unless otherwise noted below.  face  Mid face erythema with telangiectasias   right forehead Well healed scar with no evidence of recurrence.        left chest x 1 Erythematous keratotic or waxy stuck-on papule or plaque.   Assessment & Plan  Rosacea face Chronic condition with duration or expected duration over one year. Currently better controlled, but not to goal.  Rosacea is a chronic progressive skin condition usually affecting the face of adults, causing redness and/or acne bumps. It is treatable but not curable. It sometimes affects the eyes (ocular rosacea) as well. It may respond to topical and/or systemic medication and can flare with stress, sun exposure, alcohol, exercise and some foods.  Daily application of broad spectrum spf 30+ sunscreen to face is recommended to reduce flares.  Continue [facility] metronidazole/ivermectin/azelaic acid twice daily as needed  to affected areas on the face. The patient was advised this is not covered by insurance. They will receive an email to check out and the medication will be mailed to their home.   New prescription sent in to [facility] for 6 rfs.   Discussed bbl laser treatment to help with vessels at face  Tattoo right forehead Graphite Tattoo from past trauma from pencil Benign-appearing.  Observation.  Call clinic for new or changing lesions.  Recommend daily use of broad spectrum spf 30+ sunscreen to sun-exposed areas. .  Inflamed seborrheic keratosis left chest x 1 Destruction of lesion - left chest x 1 Complexity: simple   Destruction method: cryotherapy   Informed consent: discussed and consent obtained   Timeout:  patient name, date of birth, surgical site, and procedure verified Lesion destroyed using liquid nitrogen: Yes   Region frozen until ice ball extended beyond lesion: Yes   Outcome: patient tolerated procedure well with no complications   Post-procedure details: wound care instructions given   Additional details:  Prior to procedure, discussed risks of blister formation, small wound, skin dyspigmentation, or rare scar following cryotherapy. Recommend Vaseline ointment to treated areas while healing.  Skin cancer screening  Lentigines - Scattered tan macules - Due to sun exposure - Benign-appearing, observe - Recommend daily broad spectrum sunscreen SPF 30+ to sun-exposed areas, reapply every 2 hours as needed. - Call for any changes  Seborrheic Keratoses - Stuck-on, waxy, tan-brown papules and/or plaques  - Benign-appearing - Discussed benign etiology and prognosis. - Observe - Call for any changes  Melanocytic Nevi - Tan-brown  and/or pink-flesh-colored symmetric macules and papules - Benign appearing on exam today - Observation - Call clinic for new or changing moles - Recommend daily use of broad spectrum spf 30+ sunscreen to sun-exposed areas.   Hemangiomas -  Red papules - Discussed benign nature - Observe - Call for any changes  Actinic Damage - Chronic condition, secondary to cumulative UV/sun exposure - diffuse scaly erythematous macules with underlying dyspigmentation - Recommend daily broad spectrum sunscreen SPF 30+ to sun-exposed areas, reapply every 2 hours as needed.  - Staying in the shade or wearing long sleeves, sun glasses (UVA+UVB protection) and wide brim hats (4-inch brim around the entire circumference of the hat) are also recommended for sun protection.  - Call for new or changing lesions.  History of Basal Cell Carcinoma of the Skin - No evidence of recurrence today right forehead  - Recommend regular full body skin exams - Recommend daily broad spectrum sunscreen SPF 30+ to sun-exposed areas, reapply every 2 hours as needed.  - Call if any new or changing lesions are noted between office visits  History of Dysplastic Nevi - No evidence of recurrence today right knee - Recommend regular full body skin exams - Recommend daily broad spectrum sunscreen SPF 30+ to sun-exposed areas, reapply every 2 hours as needed.  - Call if any new or changing lesions are noted between office visits  Skin cancer screening performed today.  Return for 1 year tbse . IRuthell Rummage, CMA, am acting as scribe for Sarina Ser, MD. Documentation: I have reviewed the above documentation for accuracy and completeness, and I agree with the above.  Sarina Ser, MD

## 2021-09-05 NOTE — Patient Instructions (Addendum)
Cryotherapy Aftercare  Wash gently with soap and water everyday.   Apply Vaseline and Band-Aid daily until healed.     Melanoma ABCDEs  Melanoma is the most dangerous type of skin cancer, and is the leading cause of death from skin disease.  You are more likely to develop melanoma if you: Have light-colored skin, light-colored eyes, or red or blond hair Spend a lot of time in the sun Tan regularly, either outdoors or in a tanning bed Have had blistering sunburns, especially during childhood Have a close family member who has had a melanoma Have atypical moles or large birthmarks  Early detection of melanoma is key since treatment is typically straightforward and cure rates are extremely high if we catch it early.   The first sign of melanoma is often a change in a mole or a new dark spot.  The ABCDE system is a way of remembering the signs of melanoma.  A for asymmetry:  The two halves do not match. B for border:  The edges of the growth are irregular. C for color:  A mixture of colors are present instead of an even brown color. D for diameter:  Melanomas are usually (but not always) greater than 5mm - the size of a pencil eraser. E for evolution:  The spot keeps changing in size, shape, and color.  Please check your skin once per month between visits. You can use a small mirror in front and a large mirror behind you to keep an eye on the back side or your body.   If you see any new or changing lesions before your next follow-up, please call to schedule a visit.  Please continue daily skin protection including broad spectrum sunscreen SPF 30+ to sun-exposed areas, reapplying every 2 hours as needed when you're outdoors.   Staying in the shade or wearing long sleeves, sun glasses (UVA+UVB protection) and wide brim hats (4-inch brim around the entire circumference of the hat) are also recommended for sun protection.    If you have any questions or concerns for your doctor, please  call our main line at [phone] and press option 4 to reach your doctor's medical assistant. If no one answers, please leave a voicemail as directed and we will return your call as soon as possible. Messages left after 4 pm will be answered the following business day.   You may also send [place] a message via [place]. We typically respond to MyChart messages within 1-2 business days.  For prescription refills, please ask your pharmacy to contact our office. Our fax number is [phone].  If you have an urgent issue when the clinic is closed that cannot wait until the next business day, you can page your doctor at the number below.    Please note that while we do our best to be available for urgent issues outside of office hours, we are not available 24/7.   If you have an urgent issue and are unable to reach [place], you may choose to seek medical care at your doctor's office, retail clinic, urgent care center, or emergency room.  If you have a medical emergency, please immediately call 911 or go to the emergency department.  Pager Numbers  - Dr. Nehemiah Massed: [phone]  - Dr. Laurence Ferrari: [phone]  - Dr. Nicole Kindred: [phone]  In the event of inclement weather, please call our main line at [phone] for an update on the status of any delays or closures.  Dermatology Medication Tips: Please keep the boxes  that topical medications come in in order to help keep track of the instructions about where and how to use these. Pharmacies typically print the medication instructions only on the boxes and not directly on the medication tubes.   If your medication is too expensive, please contact our office at [phone] option 4 or send [place] a message through [place].   We are unable to tell what your co-pay for medications will be in advance as this is different depending on your insurance coverage. However, we may be able to find a substitute medication at lower cost or fill out paperwork to get  insurance to cover a needed medication.   If a prior authorization is required to get your medication covered by your insurance company, please allow [place] 1-2 business days to complete this process.  Drug prices often vary depending on where the prescription is filled and some pharmacies may offer cheaper prices.  The website www.goodrx.com contains coupons for medications through different pharmacies. The prices here do not account for what the cost may be with help from insurance (it may be cheaper with your insurance), but the website can give you the price if you did not use any insurance.  - You can print the associated coupon and take it with your prescription to the pharmacy.  - You may also stop by our office during regular business hours and pick up a GoodRx coupon card.  - If you need your prescription sent electronically to a different pharmacy, notify our office through [facility] or by phone at [phone] option 4.

## 2021-09-11 ENCOUNTER — Encounter: Payer: Self-pay | Admitting: Dermatology

## 2021-09-22 ENCOUNTER — Other Ambulatory Visit: Payer: Self-pay | Admitting: Obstetrics and Gynecology

## 2021-09-22 DIAGNOSIS — Z1231 Encounter for screening mammogram for malignant neoplasm of breast: Secondary | ICD-10-CM

## 2021-09-27 ENCOUNTER — Ambulatory Visit
Admission: RE | Admit: 2021-09-27 | Discharge: 2021-09-27 | Disposition: A | Discharge status: Home / Self Care | Payer: 59 | Source: Ambulatory Visit | Attending: Obstetrics and Gynecology | Admitting: Obstetrics and Gynecology

## 2021-09-27 ENCOUNTER — Other Ambulatory Visit: Payer: Self-pay

## 2021-09-27 DIAGNOSIS — Z1231 Encounter for screening mammogram for malignant neoplasm of breast: Secondary | ICD-10-CM | POA: Diagnosis present

## 2021-10-04 ENCOUNTER — Other Ambulatory Visit: Payer: Self-pay | Admitting: Obstetrics and Gynecology

## 2021-10-04 DIAGNOSIS — R928 Other abnormal and inconclusive findings on diagnostic imaging of breast: Secondary | ICD-10-CM

## 2021-10-04 DIAGNOSIS — N6489 Other specified disorders of breast: Secondary | ICD-10-CM

## 2021-10-12 ENCOUNTER — Encounter: Payer: Self-pay | Admitting: *Deleted

## 2021-10-12 NOTE — H&P (Signed)
Pre-Procedure H&P   Patient ID: Pamela Lee is a 45 y.o. female.  Gastroenterology Provider: Annamaria Helling, DO  Referring Provider: Laurine Blazer, PA PCP: Donnamarie Rossetti, PA-C  Date: 10/13/2021  HPI Ms. Pamela Lee is a 45 y.o. female who presents today for Esophagogastroduodenoscopy and Colonoscopy for Barretts Screen, Diarrhea, personal and family history of colon polyps and family history of colon cancer.  Pt with long standing gerd controlled on 20 mg omepraozle daily. Notes food triggers. No abdominal pain. Notes loose stools 3-4 times per day with urgency. No abdominal pain.  Hgb 13.6, mcv 19  Fhx crc- mgm, mgf, gmgm, maternal aunt Fhx polyps- mother and sister Phx polyps- colonoscopy 2019- 4 adenomatous polyps with 3 year rpt recommended  Past Medical History:  Diagnosis Date   Allergic genetic state    Atypical chest pain    Basal cell carcinoma    right forehead (treated by Dr. Tyler Deis)   Cancer [facility])    basal cell   Colon polyp    Endometriosis of uterus    Fibroid    GERD (gastroesophageal reflux disease)    Headache    Hx of dysplastic nevus 07/29/2015   R lat knee - severe atypia, excised 08/31/2015   Hypertension    Mitral valve prolapse    Obesity    Sleep apnea    Vertigo     Past Surgical History:  Procedure Laterality Date   ABDOMINAL HYSTERECTOMY  04/23/2015   CESAREAN SECTION     COLONOSCOPY WITH PROPOFOL N/A 09/02/2018   Procedure: COLONOSCOPY WITH PROPOFOL;  Surgeon: Manya Silvas, MD;  Location: [facility] ENDOSCOPY;  Service: Endoscopy;  Laterality: N/A;   CYSTOSCOPY N/A 05/03/2015   Procedure: CYSTOSCOPY;  Surgeon: Benjaman Kindler, MD;  Location: [facility] ORS;  Service: Gynecology;  Laterality: N/A;   endometrial ablation  06/23/2011   NOVASURE ABLATION     POLYPECTOMY     ROBOTIC ASSISTED TOTAL HYSTERECTOMY N/A 05/03/2015   Procedure: ROBOTIC ASSISTED TOTAL HYSTERECTOMY/BIL.SALPINGECTOMY;  Surgeon: Benjaman Kindler,  MD;  Location: [facility] ORS;  Service: Gynecology;  Laterality: N/A;   TUBAL LIGATION      Family History Fhx crc- mgm, mgf, gmgm, maternal aunt Fhx polyps- mother and sister No other h/o GI disease or malignancy  Review of Systems  Constitutional:  Negative for activity change, appetite change, chills, diaphoresis, fatigue, fever and unexpected weight change.  HENT:  Negative for trouble swallowing and voice change.   Respiratory:  Negative for shortness of breath and wheezing.   Cardiovascular:  Negative for chest pain, palpitations and leg swelling.  Gastrointestinal:  Positive for diarrhea. Negative for abdominal distention, abdominal pain, anal bleeding, blood in stool, constipation, nausea, rectal pain and vomiting.       Gerd  Musculoskeletal:  Negative for arthralgias and myalgias.  Skin:  Negative for color change and pallor.  Neurological:  Negative for dizziness, syncope and weakness.  Psychiatric/Behavioral:  Negative for confusion.   All other systems reviewed and are negative.   Medications No current facility-administered medications on file prior to encounter.   Current Outpatient Medications on File Prior to Encounter  Medication Sig Dispense Refill   fexofenadine (ALLEGRA) 180 MG tablet Take 180 mg by mouth daily.     lisinopril-hydrochlorothiazide (ZESTORETIC) 10-12.5 MG tablet Take 1 tablet by mouth daily.     Misc Natural Products (AIRBORNE ELDERBERRY) CHEW Chew by mouth.     omeprazole (PRILOSEC) 20 MG capsule Take 20 mg by mouth 2 (  two) times daily before a meal. 30-45 minutes before meal     acetaminophen (TYLENOL) 500 MG tablet Take 1,000 mg by mouth every 6 (six) hours as needed for mild pain or headache.      cetirizine (ZYRTEC) 10 MG tablet Take 10 mg by mouth daily as needed for allergies.     ibuprofen (ADVIL,MOTRIN) 600 MG tablet Take 1 tablet (600 mg total) by mouth every 6 (six) hours as needed for fever or headache. 30 tablet 0   potassium chloride SA  (K-DUR,KLOR-CON) 20 MEQ tablet Take 2 tablets (40 mEq total) by mouth once. 30 tablet 0    Pertinent medications related to GI and procedure were reviewed by me with the patient prior to the procedure   Current Facility-Administered Medications:    0.9 %  sodium chloride infusion, , Intravenous, Continuous, Annamaria Helling, DO      Allergies  Allergen Reactions   Lactose Intolerance (Gi) Diarrhea   Allergies were reviewed by me prior to the procedure  Objective    Vitals:   10/13/21 0726  BP: (!) 152/110  Pulse: 96  Resp: 18  Temp: (!) 97.5 F (36.4 C)  TempSrc: Temporal  SpO2: 98%  Weight: 125 kg  Height: 5' 2.5" (1.588 m)    Physical Exam Vitals and nursing note reviewed.  Constitutional:      General: She is not in acute distress.    Appearance: Normal appearance. She is obese. She is not ill-appearing, toxic-appearing or diaphoretic.  HENT:     Head: Normocephalic and atraumatic.     Nose: Nose normal.     Mouth/Throat:     Mouth: Mucous membranes are moist.     Pharynx: Oropharynx is clear.  Eyes:     General: No scleral icterus.    Extraocular Movements: Extraocular movements intact.  Cardiovascular:     Rate and Rhythm: Normal rate and regular rhythm.     Heart sounds: Normal heart sounds. No murmur heard.   No friction rub. No gallop.  Pulmonary:     Effort: Pulmonary effort is normal. No respiratory distress.     Breath sounds: Normal breath sounds. No wheezing, rhonchi or rales.  Abdominal:     General: Bowel sounds are normal. There is no distension.     Palpations: Abdomen is soft.     Tenderness: There is no abdominal tenderness. There is no guarding or rebound.  Musculoskeletal:     Cervical back: Neck supple.     Right lower leg: No edema.     Left lower leg: No edema.  Skin:    General: Skin is warm and dry.     Coloration: Skin is not jaundiced or pale.  Neurological:     General: No focal deficit present.     Mental Status:  She is alert and oriented to person, place, and time. Mental status is at baseline.  Psychiatric:        Mood and Affect: Mood normal.        Behavior: Behavior normal.        Thought Content: Thought content normal.        Judgment: Judgment normal.     Assessment:  Ms. Pamela Lee is a 45 y.o. female  who presents today for Esophagogastroduodenoscopy and Colonoscopy for Barretts Screen, Diarrhea, personal and family history of colon polyps and family history of colon cancer.  Plan:  Esophagogastroduodenoscopy and Colonoscopy with possible intervention today  Esophagogastroduodenoscopy and Colonoscopy with possible  biopsy, control of bleeding, polypectomy, and interventions as necessary has been discussed with the patient/patient representative. Informed consent was obtained from the patient/patient representative after explaining the indication, nature, and risks of the procedure including but not limited to death, bleeding, perforation, missed neoplasm/lesions, cardiorespiratory compromise, and reaction to medications. Opportunity for questions was given and appropriate answers were provided. Patient/patient representative has verbalized understanding is amenable to undergoing the procedure.   Annamaria Helling, DO  [facility] Gastroenterology  Portions of the record may have been created with voice recognition software. Occasional wrong-word or 'sound-a-like' substitutions may have occurred due to the inherent limitations of voice recognition software.  Read the chart carefully and recognize, using context, where substitutions may have occurred.

## 2021-10-13 ENCOUNTER — Ambulatory Visit: Payer: 59 | Admitting: Anesthesiology

## 2021-10-13 ENCOUNTER — Encounter
Admission: RE | Disposition: A | Discharge status: Home / Self Care | Payer: Self-pay | Source: Ambulatory Visit | Attending: Gastroenterology

## 2021-10-13 ENCOUNTER — Encounter: Payer: Self-pay | Admitting: *Deleted

## 2021-10-13 ENCOUNTER — Ambulatory Visit
Admission: RE | Admit: 2021-10-13 | Discharge: 2021-10-13 | Disposition: A | Discharge status: Home / Self Care | Payer: 59 | Source: Ambulatory Visit | Attending: Gastroenterology | Admitting: Gastroenterology

## 2021-10-13 DIAGNOSIS — D127 Benign neoplasm of rectosigmoid junction: Secondary | ICD-10-CM | POA: Diagnosis not present

## 2021-10-13 DIAGNOSIS — E669 Obesity, unspecified: Secondary | ICD-10-CM | POA: Diagnosis not present

## 2021-10-13 DIAGNOSIS — Z6841 Body Mass Index (BMI) 40.0 and over, adult: Secondary | ICD-10-CM | POA: Diagnosis not present

## 2021-10-13 DIAGNOSIS — K219 Gastro-esophageal reflux disease without esophagitis: Secondary | ICD-10-CM | POA: Insufficient documentation

## 2021-10-13 DIAGNOSIS — Z86010 Personal history of colonic polyps: Secondary | ICD-10-CM | POA: Insufficient documentation

## 2021-10-13 DIAGNOSIS — D120 Benign neoplasm of cecum: Secondary | ICD-10-CM | POA: Diagnosis not present

## 2021-10-13 DIAGNOSIS — K640 First degree hemorrhoids: Secondary | ICD-10-CM | POA: Insufficient documentation

## 2021-10-13 DIAGNOSIS — Z08 Encounter for follow-up examination after completed treatment for malignant neoplasm: Secondary | ICD-10-CM | POA: Diagnosis not present

## 2021-10-13 DIAGNOSIS — Z79899 Other long term (current) drug therapy: Secondary | ICD-10-CM | POA: Diagnosis not present

## 2021-10-13 DIAGNOSIS — G4730 Sleep apnea, unspecified: Secondary | ICD-10-CM | POA: Diagnosis not present

## 2021-10-13 DIAGNOSIS — I10 Essential (primary) hypertension: Secondary | ICD-10-CM | POA: Insufficient documentation

## 2021-10-13 HISTORY — DX: Benign neoplasm of connective and other soft tissue, unspecified: D21.9

## 2021-10-13 HISTORY — DX: Endometriosis of the uterus, unspecified: N80.00

## 2021-10-13 HISTORY — DX: Dizziness and giddiness: R42

## 2021-10-13 HISTORY — DX: Obesity, unspecified: E66.9

## 2021-10-13 HISTORY — DX: Polyp of colon: K63.5

## 2021-10-13 HISTORY — DX: Allergy status to unspecified drugs, medicaments and biological substances: Z88.9

## 2021-10-13 HISTORY — DX: Headache, unspecified: R51.9

## 2021-10-13 HISTORY — PX: ESOPHAGOGASTRODUODENOSCOPY (EGD) WITH PROPOFOL: SHX5813

## 2021-10-13 HISTORY — DX: Other chest pain: R07.89

## 2021-10-13 HISTORY — PX: COLONOSCOPY WITH PROPOFOL: SHX5780

## 2021-10-13 SURGERY — COLONOSCOPY WITH PROPOFOL
Anesthesia: General

## 2021-10-13 MED ORDER — SODIUM CHLORIDE 0.9 % IV SOLN
INTRAVENOUS | Status: DC
  Administered 2021-10-13: 08:00:00 1000 mL via INTRAVENOUS

## 2021-10-13 MED ORDER — PROPOFOL 10 MG/ML IV BOLUS
INTRAVENOUS | Status: DC | PRN
  Administered 2021-10-13: 100 mg via INTRAVENOUS

## 2021-10-13 MED ORDER — PHENYLEPHRINE HCL (PRESSORS) 10 MG/ML IV SOLN
INTRAVENOUS | Status: DC | PRN
  Administered 2021-10-13: 700 ug via INTRAVENOUS

## 2021-10-13 MED ORDER — PROPOFOL 500 MG/50ML IV EMUL
INTRAVENOUS | Status: DC | PRN
  Administered 2021-10-13: 200 ug/kg/min via INTRAVENOUS

## 2021-10-13 NOTE — Anesthesia Preprocedure Evaluation (Signed)
Anesthesia Evaluation  Patient identified by MRN, date of birth, ID band Patient awake    Reviewed: Allergy & Precautions, NPO status , Patient's Chart, lab work & pertinent test results  Airway Mallampati: III  TM Distance: >3 FB Neck ROM: full  Mouth opening: Limited Mouth Opening  Dental  (+) Teeth Intact   Pulmonary neg pulmonary ROS, sleep apnea , former smoker,    Pulmonary exam normal  + decreased breath sounds      Cardiovascular Exercise Tolerance: Good hypertension, Pt. on medications negative cardio ROS Normal cardiovascular exam Rhythm:Regular Rate:Normal     Neuro/Psych  Headaches, negative neurological ROS  negative psych ROS   GI/Hepatic negative GI ROS, Neg liver ROS, GERD  ,  Endo/Other  negative endocrine ROS  Renal/GU negative Renal ROS  negative genitourinary   Musculoskeletal negative musculoskeletal ROS (+)   Abdominal (+) + obese,   Peds negative pediatric ROS (+)  Hematology negative hematology ROS (+)   Anesthesia Other Findings Past Medical History: No date: Allergic genetic state No date: Atypical chest pain No date: Basal cell carcinoma     Comment:  right forehead (treated by Dr. Tyler Deis) No date: Cancer [facility])     Comment:  basal cell No date: Colon polyp No date: Endometriosis of uterus No date: Fibroid No date: GERD (gastroesophageal reflux disease) No date: Headache 07/29/2015: Hx of dysplastic nevus     Comment:  R lat knee - severe atypia, excised 08/31/2015 No date: Hypertension No date: Mitral valve prolapse No date: Obesity No date: Sleep apnea No date: Vertigo  Past Surgical History: 04/23/2015: ABDOMINAL HYSTERECTOMY No date: CESAREAN SECTION 09/02/2018: COLONOSCOPY WITH PROPOFOL; N/A     Comment:  Procedure: COLONOSCOPY WITH PROPOFOL;  Surgeon: Manya Silvas, MD;  Location: [facility] ENDOSCOPY;  Service:               Endoscopy;  Laterality:  N/A; 05/03/2015: CYSTOSCOPY; N/A     Comment:  Procedure: CYSTOSCOPY;  Surgeon: Benjaman Kindler, MD;                Location: [facility] ORS;  Service: Gynecology;  Laterality:               N/A; 06/23/2011: endometrial ablation No date: NOVASURE ABLATION No date: POLYPECTOMY 05/03/2015: ROBOTIC ASSISTED TOTAL HYSTERECTOMY; N/A     Comment:  Procedure: ROBOTIC ASSISTED TOTAL               HYSTERECTOMY/BIL.SALPINGECTOMY;  Surgeon: Benjaman Kindler, MD;  Location: [facility] ORS;  Service: Gynecology;                Laterality: N/A; No date: TUBAL LIGATION  BMI    Body Mass Index: 49.60 kg/m      Reproductive/Obstetrics negative OB ROS                             Anesthesia Physical Anesthesia Plan  ASA: 3  Anesthesia Plan: General   Post-op Pain Management:    Induction: Intravenous  PONV Risk Score and Plan: 1 and Ondansetron  Airway Management Planned: Natural Airway and Nasal Cannula  Additional Equipment:   Intra-op Plan:   Post-operative Plan:   Informed Consent: I have reviewed the patients History and Physical, chart, labs and discussed the procedure including the risks,  benefits and alternatives for the proposed anesthesia with the patient or authorized representative who has indicated his/her understanding and acceptance.     Dental Advisory Given  Plan Discussed with: CRNA and Surgeon  Anesthesia Plan Comments:         Anesthesia Quick Evaluation

## 2021-10-13 NOTE — Op Note (Signed)
[facility] Gastroenterology Patient Name: Pamela Lee Procedure Date: 10/13/2021 8:06 AM MRN: [id number] Account #: [id number] Date of Birth: 1975-12-21 Admit Type: Outpatient Age: 45 Room: [facility] ENDO ROOM 1 Gender: Female Note Status: Finalized Instrument Name: Upper Endoscope [id number] Procedure:             Upper GI endoscopy Indications:           Gastro-esophageal reflux disease Providers:             Annamaria Helling DO, DO Medicines:             Monitored Anesthesia Care Complications:         No immediate complications. Estimated blood loss:                         Minimal. Procedure:             Pre-Anesthesia Assessment:                        - Prior to the procedure, a History and Physical was                         performed, and patient medications and allergies were                         reviewed. The patient is competent. The risks and                         benefits of the procedure and the sedation options and                         risks were discussed with the patient. All questions                         were answered and informed consent was obtained.                         Patient identification and proposed procedure were                         verified by the physician, the nurse, the anesthetist                         and the technician in the endoscopy suite. Mental                         Status Examination: alert and oriented. Airway                         Examination: normal oropharyngeal airway and neck                         mobility. Respiratory Examination: clear to                         auscultation. CV Examination: RRR, no murmurs, no S3                         or S4. Prophylactic Antibiotics: The patient does  not                         require prophylactic antibiotics. Prior                         Anticoagulants: The patient has taken no previous                         anticoagulant or antiplatelet agents. ASA  Grade                         Assessment: III - A patient with severe systemic                         disease. After reviewing the risks and benefits, the                         patient was deemed in satisfactory condition to                         undergo the procedure. The anesthesia plan was to use                         monitored anesthesia care (MAC). Immediately prior to                         administration of medications, the patient was                         re-assessed for adequacy to receive sedatives. The                         heart rate, respiratory rate, oxygen saturations,                         blood pressure, adequacy of pulmonary ventilation, and                         response to care were monitored throughout the                         procedure. The physical status of the patient was                         re-assessed after the procedure.                        After obtaining informed consent, the endoscope was                         passed under direct vision. Throughout the procedure,                         the patient's blood pressure, pulse, and oxygen                         saturations were monitored continuously. The Endoscope  was introduced through the mouth, and advanced to the                         second part of duodenum. The upper GI endoscopy was                         accomplished without difficulty. The patient tolerated                         the procedure well. Findings:      The duodenal bulb, first portion of the duodenum and second portion of       the duodenum were normal. Biopsies for histology were taken with a cold       forceps for evaluation of celiac disease. Estimated blood loss was       minimal.      The entire examined stomach was normal. Biopsies were taken with a cold       forceps for Helicobacter pylori testing. Estimated blood loss was       minimal.      The Z-line was regular. Estimated  blood loss: none.      Esophagogastric landmarks were identified: the gastroesophageal junction       was found at 37 cm from the incisors.      The exam of the esophagus was otherwise normal. Impression:            - Normal duodenal bulb, first portion of the duodenum                         and second portion of the duodenum. Biopsied.                        - Normal stomach. Biopsied.                        - Z-line regular.                        - Esophagogastric landmarks identified. Recommendation:        - Discharge patient to home.                        - Resume previous diet.                        - Continue present medications.                        - Await pathology results.                        - Return to GI clinic as previously scheduled. Procedure Code(s):     --- Professional ---                        [phone], Esophagogastroduodenoscopy, flexible,                         transoral; with biopsy, single or multiple Diagnosis Code(s):     --- Professional ---  K21.9, Gastro-esophageal reflux disease without                         esophagitis CPT copyright 2019 American Medical Association. All rights reserved. The codes documented in this report are preliminary and upon coder review may  be revised to meet current compliance requirements. Attending Participation:      I personally performed the entire procedure. Volney American, DO Annamaria Helling DO, DO 10/13/2021 8:52:13 AM This report has been signed electronically. Number of Addenda: 0 Note Initiated On: 10/13/2021 8:06 AM Estimated Blood Loss:  Estimated blood loss was minimal.      [facility]

## 2021-10-13 NOTE — Transfer of Care (Signed)
Immediate Anesthesia Transfer of Care Note  Patient: Pamela Lee  Procedure(s) Performed: COLONOSCOPY WITH PROPOFOL ESOPHAGOGASTRODUODENOSCOPY (EGD) WITH PROPOFOL  Patient Location: PACU  Anesthesia Type:General  Level of Consciousness: sedated  Airway & Oxygen Therapy: Patient Spontanous Breathing and Patient connected to face mask oxygen  Post-op Assessment: Report given to RN and Post -op Vital signs reviewed and stable  Post vital signs: Reviewed and stable  Last Vitals:  Vitals Value Taken Time  BP 123/77 10/13/21 0850  Temp    Pulse 103 10/13/21 0851  Resp 19 10/13/21 0851  SpO2 99 % 10/13/21 0851  Vitals shown include unvalidated device data.  Last Pain:  Vitals:   10/13/21 0726  TempSrc: Temporal  PainSc: 0-No pain         Complications: No notable events documented.

## 2021-10-13 NOTE — Interval H&P Note (Signed)
History and Physical Interval Note: Preprocedure H&P from 10/13/21  was reviewed and there was no interval change after seeing and examining the patient.  Written consent was obtained from the patient after discussion of risks, benefits, and alternatives. Patient has consented to proceed with Esophagogastroduodenoscopy and Colonoscopy with possible intervention   10/13/2021 8:01 AM  Pamela Lee  has presented today for surgery, with the diagnosis of GERD, Personal hx polyps.  The various methods of treatment have been discussed with the patient and family. After consideration of risks, benefits and other options for treatment, the patient has consented to  Procedure(s): COLONOSCOPY WITH PROPOFOL (N/A) ESOPHAGOGASTRODUODENOSCOPY (EGD) WITH PROPOFOL (N/A) as a surgical intervention.  The patient's history has been reviewed, patient examined, no change in status, stable for surgery.  I have reviewed the patient's chart and labs.  Questions were answered to the patient's satisfaction.     Annamaria Helling

## 2021-10-13 NOTE — Op Note (Signed)
[facility] Gastroenterology Patient Name: Pamela Lee Procedure Date: 10/13/2021 8:05 AM MRN: [id number] Account #: [id number] Date of Birth: 1976/01/08 Admit Type: Outpatient Age: 45 Room: [facility] ENDO ROOM 1 Gender: Female Note Status: Finalized Instrument Name: Colonoscope [id number] Procedure:             Colonoscopy Indications:           High risk colon cancer surveillance: Personal history                         of colonic polyps Providers:             Annamaria Helling DO, DO Referring MD:          No Local Md, MD (Referring MD) Medicines:             Monitored Anesthesia Care Complications:         No immediate complications. Estimated blood loss:                         Minimal. Procedure:             Pre-Anesthesia Assessment:                        - Prior to the procedure, a History and Physical was                         performed, and patient medications and allergies were                         reviewed. The patient is competent. The risks and                         benefits of the procedure and the sedation options and                         risks were discussed with the patient. All questions                         were answered and informed consent was obtained.                         Patient identification and proposed procedure were                         verified by the physician, the nurse, the anesthetist                         and the technician in the endoscopy suite. Mental                         Status Examination: alert and oriented. Airway                         Examination: normal oropharyngeal airway and neck                         mobility. Respiratory Examination: clear to  auscultation. CV Examination: RRR, no murmurs, no S3                         or S4. Prophylactic Antibiotics: The patient does not                         require prophylactic antibiotics. Prior                          Anticoagulants: The patient has taken no previous                         anticoagulant or antiplatelet agents. ASA Grade                         Assessment: III - A patient with severe systemic                         disease. After reviewing the risks and benefits, the                         patient was deemed in satisfactory condition to                         undergo the procedure. The anesthesia plan was to use                         monitored anesthesia care (MAC). Immediately prior to                         administration of medications, the patient was                         re-assessed for adequacy to receive sedatives. The                         heart rate, respiratory rate, oxygen saturations,                         blood pressure, adequacy of pulmonary ventilation, and                         response to care were monitored throughout the                         procedure. The physical status of the patient was                         re-assessed after the procedure.                        After obtaining informed consent, the colonoscope was                         passed under direct vision. Throughout the procedure,                         the patient's blood pressure, pulse, and oxygen  saturations were monitored continuously. The                         Colonoscope was introduced through the anus and                         advanced to the the cecum, identified by appendiceal                         orifice and ileocecal valve. The colonoscopy was                         performed without difficulty. The patient tolerated                         the procedure well. The quality of the bowel                         preparation was evaluated using the BBPS [facility] Bowel                         Preparation Scale) with scores of: Right Colon = 2                         (minor amount of residual staining, small fragments of                          stool and/or opaque liquid, but mucosa seen well),                         Transverse Colon = 3 (entire mucosa seen well with no                         residual staining, small fragments of stool or opaque                         liquid) and Left Colon = 3 (entire mucosa seen well                         with no residual staining, small fragments of stool or                         opaque liquid). The total BBPS score equals 8. The                         quality of the bowel preparation was excellent. The                         ileocecal valve, appendiceal orifice, and rectum were                         photographed. Findings:      The perianal and digital rectal examinations were normal. Pertinent       negatives include normal sphincter tone.      Two sessile polyps were found in the rectum and cecum. The polyps were 1       to 2 mm in  size. These polyps were removed with a cold biopsy forceps.       Resection and retrieval were complete. Estimated blood loss was minimal.      Non-bleeding internal hemorrhoids were found during retroflexion. The       hemorrhoids were Grade I (internal hemorrhoids that do not prolapse).      The exam was otherwise without abnormality on direct and retroflexion       views. Impression:            - Two 1 to 2 mm polyps in the rectum and in the cecum,                         removed with a cold biopsy forceps. Resected and                         retrieved.                        - Non-bleeding internal hemorrhoids.                        - The examination was otherwise normal on direct and                         retroflexion views. Recommendation:        - Discharge patient to home.                        - Resume previous diet.                        - Continue present medications.                        - Await pathology results.                        - Repeat colonoscopy for surveillance based on                         pathology results.                         - Return to GI office as previously scheduled. Procedure Code(s):     --- Professional ---                        [phone], Colonoscopy, flexible; with biopsy, single or                         multiple Diagnosis Code(s):     --- Professional ---                        Z86.010, Personal history of colonic polyps                        K62.1, Rectal polyp                        K63.5, Polyp of colon  K64.0, First degree hemorrhoids CPT copyright 2019 American Medical Association. All rights reserved. The codes documented in this report are preliminary and upon coder review may  be revised to meet current compliance requirements. Attending Participation:      I personally performed the entire procedure. Volney American, DO Annamaria Helling DO, DO 10/13/2021 8:54:55 AM This report has been signed electronically. Number of Addenda: 0 Note Initiated On: 10/13/2021 8:05 AM Scope Withdrawal Time: 0 hours 21 minutes 23 seconds  Total Procedure Duration: 0 hours 25 minutes 35 seconds  Estimated Blood Loss:  Estimated blood loss was minimal.      [facility]

## 2021-10-13 NOTE — Anesthesia Postprocedure Evaluation (Signed)
Anesthesia Post Note  Patient: Pamela Lee  Procedure(s) Performed: COLONOSCOPY WITH PROPOFOL ESOPHAGOGASTRODUODENOSCOPY (EGD) WITH PROPOFOL  Patient location during evaluation: PACU Anesthesia Type: General Level of consciousness: awake and awake and alert Pain management: satisfactory to patient Vital Signs Assessment: post-procedure vital signs reviewed and stable Respiratory status: respiratory function stable Cardiovascular status: stable Anesthetic complications: no   No notable events documented.   Last Vitals:  Vitals:   10/13/21 0852 10/13/21 0908  BP:  130/78  Pulse:    Resp:    Temp: (!) 36.3 C   SpO2:      Last Pain:  Vitals:   10/13/21 0908  TempSrc:   PainSc: 0-No pain                 VAN STAVEREN,Morris Markham

## 2021-10-14 ENCOUNTER — Ambulatory Visit
Admission: RE | Admit: 2021-10-14 | Discharge: 2021-10-14 | Disposition: A | Discharge status: Home / Self Care | Payer: 59 | Source: Ambulatory Visit | Attending: Obstetrics and Gynecology | Admitting: Obstetrics and Gynecology

## 2021-10-14 ENCOUNTER — Encounter: Payer: Self-pay | Admitting: Gastroenterology

## 2021-10-14 ENCOUNTER — Other Ambulatory Visit: Payer: Self-pay

## 2021-10-14 DIAGNOSIS — R928 Other abnormal and inconclusive findings on diagnostic imaging of breast: Secondary | ICD-10-CM | POA: Insufficient documentation

## 2021-10-14 DIAGNOSIS — N6489 Other specified disorders of breast: Secondary | ICD-10-CM

## 2021-10-14 LAB — SURGICAL PATHOLOGY

## 2021-10-19 ENCOUNTER — Other Ambulatory Visit: Payer: Self-pay | Admitting: Obstetrics and Gynecology

## 2021-10-19 DIAGNOSIS — N6320 Unspecified lump in the left breast, unspecified quadrant: Secondary | ICD-10-CM

## 2021-10-19 DIAGNOSIS — R928 Other abnormal and inconclusive findings on diagnostic imaging of breast: Secondary | ICD-10-CM

## 2021-10-26 ENCOUNTER — Ambulatory Visit
Admission: RE | Admit: 2021-10-26 | Discharge: 2021-10-26 | Disposition: A | Discharge status: Home / Self Care | Payer: 59 | Source: Ambulatory Visit | Attending: Obstetrics and Gynecology | Admitting: Obstetrics and Gynecology

## 2021-10-26 ENCOUNTER — Other Ambulatory Visit: Payer: Self-pay | Admitting: Obstetrics and Gynecology

## 2021-10-26 ENCOUNTER — Other Ambulatory Visit: Payer: Self-pay

## 2021-10-26 DIAGNOSIS — N6320 Unspecified lump in the left breast, unspecified quadrant: Secondary | ICD-10-CM

## 2021-10-26 DIAGNOSIS — R928 Other abnormal and inconclusive findings on diagnostic imaging of breast: Secondary | ICD-10-CM

## 2021-10-26 HISTORY — PX: BREAST CYST ASPIRATION: SHX578

## 2022-03-27 IMAGING — MG MM BREAST LOCALIZATION CLIP
6 series · 6 of 18 positions shown · non-contrast
Comparison: Previous exam(s).

CLINICAL DATA: Status post ultrasound-guided aspiration

EXAM:
3D DIAGNOSTIC LEFT MAMMOGRAM POST ULTRASOUND guided aspiration

[L MLO synth-2D (1 of 2)]
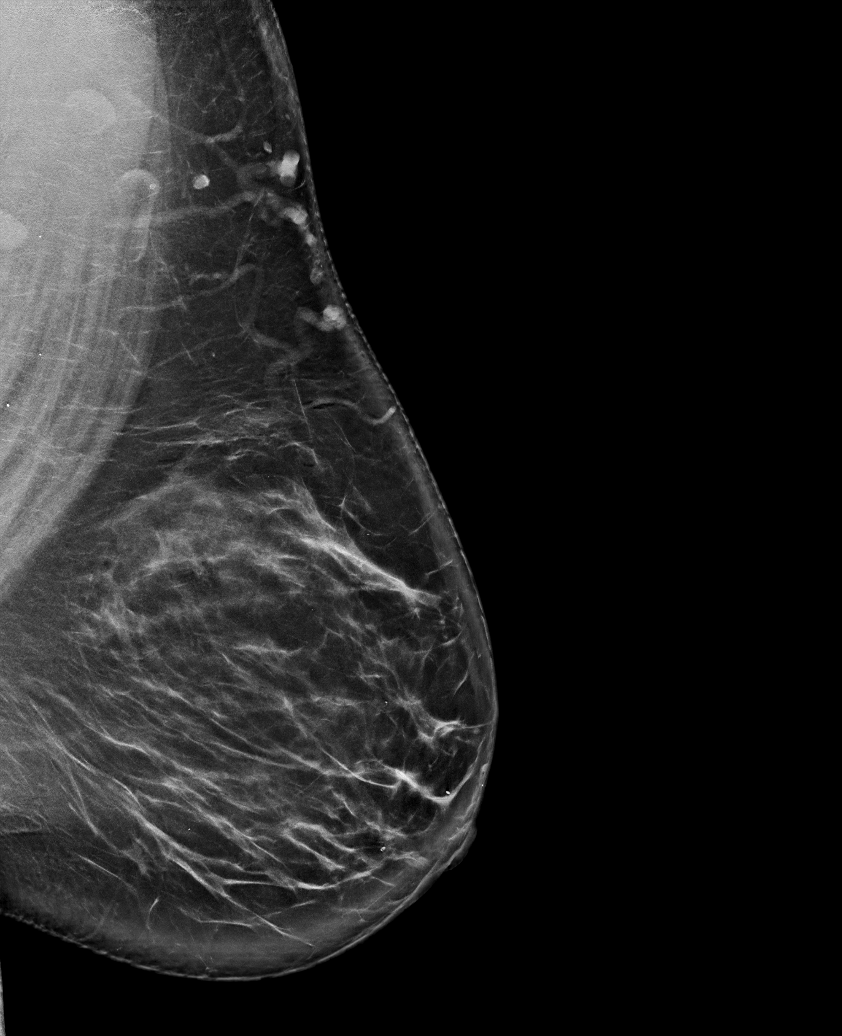

[L MLO synth-2D (2 of 2)]
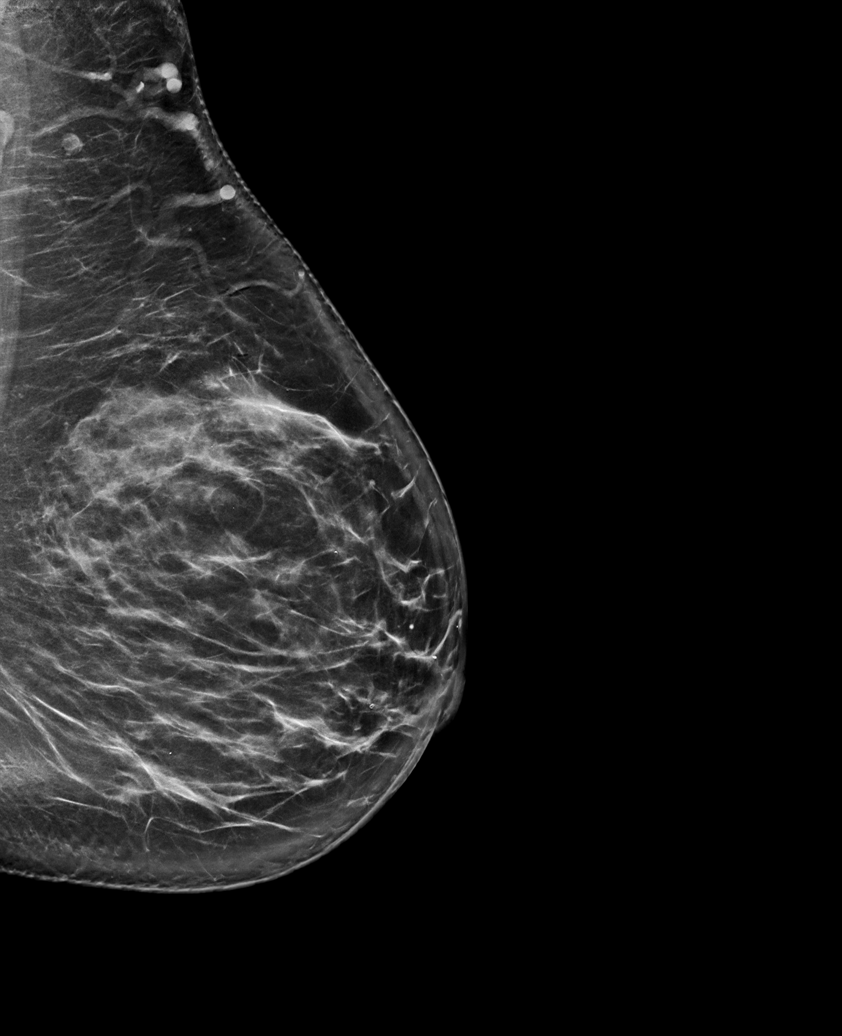

[L CC synth-2D]
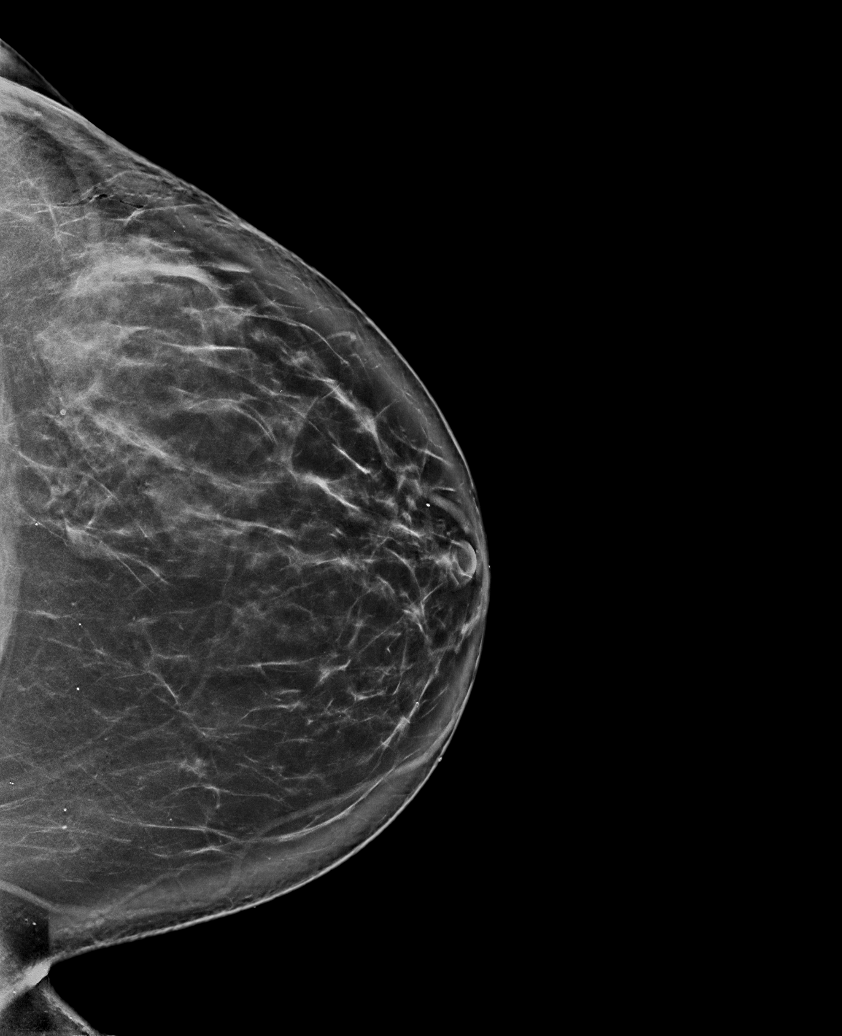

[L CC tomo · tomo slice 49/98.0]
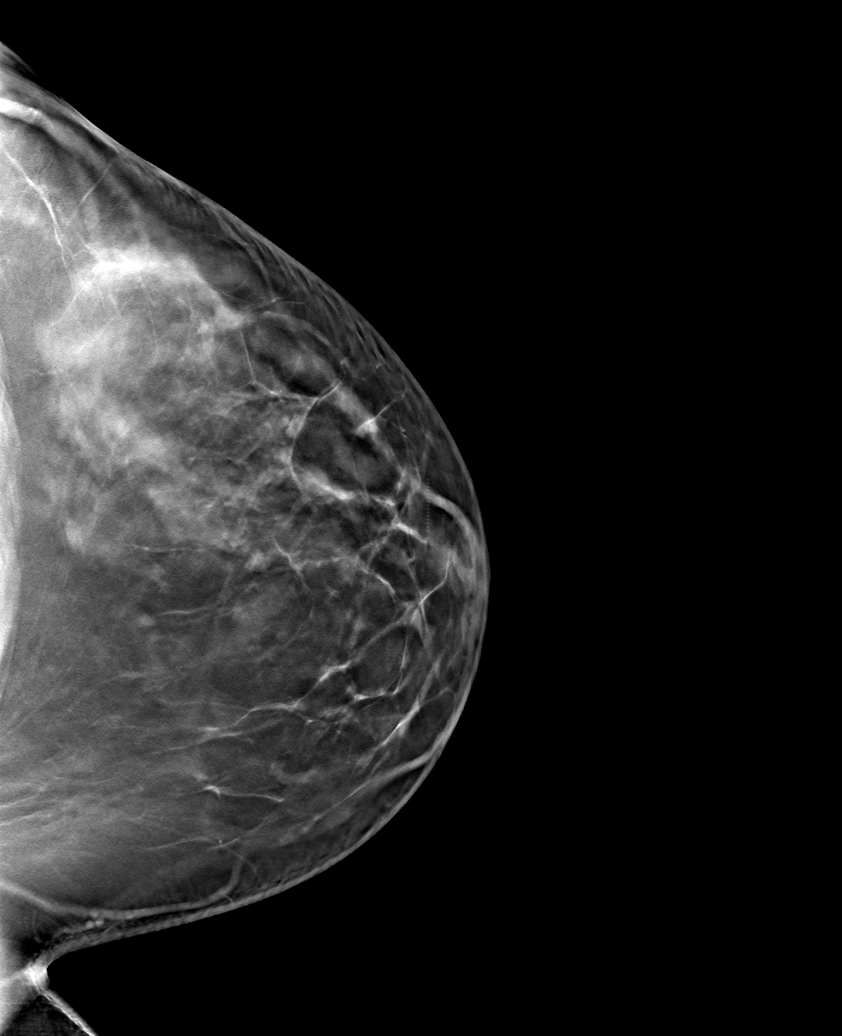

[L MLO tomo (1 of 2) · tomo slice 53/106.0]
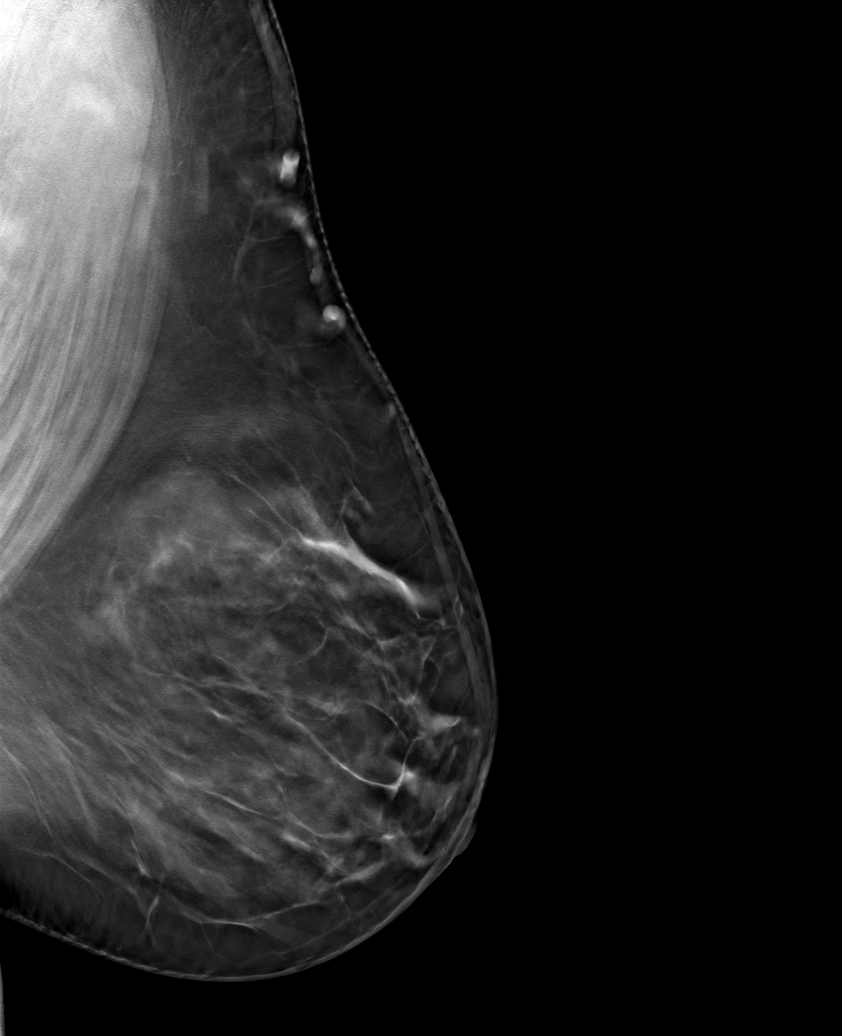

[L MLO tomo (2 of 2) · tomo slice 47/94.0]
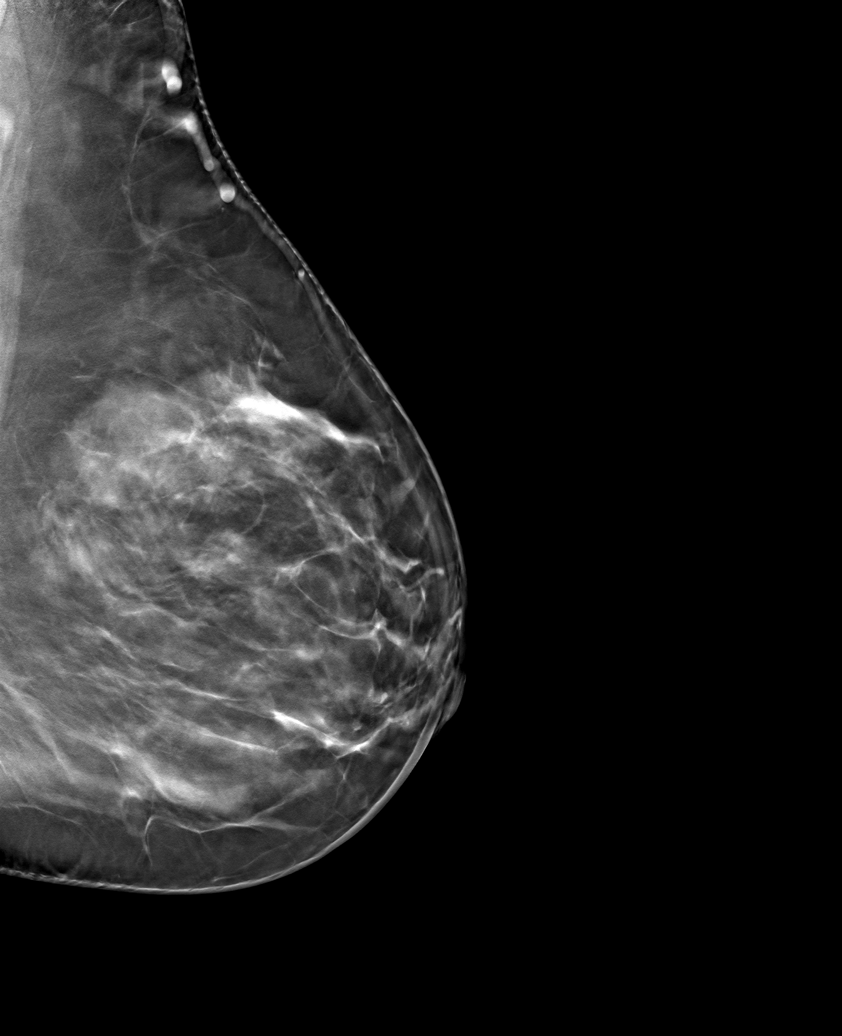

[6 of 18 positions shown; findings below may reference images not displayed]

FINDINGS: 3D Mammographic images were obtained following ultrasound guided
aspiration of of a mass at 2 o'clock 6 cm from the nipple.
Previously described mass in the LEFT upper outer breast at
posterior depth is no longer visualized. This is consistent with/
mammographic/sonographic correlation of the LEFT breast mass at 2
o'clock 6 cm from nipple.
IMPRESSION: Interval resolution of previously described LEFT breast mass,
consistent with aspiration of a sonographically correlated benign
cyst.

Final Assessment: Post Procedure Mammograms for Marker Placement

2: Benign.

## 2022-09-06 ENCOUNTER — Ambulatory Visit: Payer: 59 | Admitting: Dermatology

## 2022-09-27 ENCOUNTER — Ambulatory Visit (INDEPENDENT_AMBULATORY_CARE_PROVIDER_SITE_OTHER): Payer: 59 | Admitting: Dermatology

## 2022-09-27 ENCOUNTER — Encounter: Payer: Self-pay | Admitting: Dermatology

## 2022-09-27 DIAGNOSIS — L304 Erythema intertrigo: Secondary | ICD-10-CM | POA: Diagnosis not present

## 2022-09-27 DIAGNOSIS — L821 Other seborrheic keratosis: Secondary | ICD-10-CM

## 2022-09-27 DIAGNOSIS — L739 Follicular disorder, unspecified: Secondary | ICD-10-CM | POA: Diagnosis not present

## 2022-09-27 DIAGNOSIS — D229 Melanocytic nevi, unspecified: Secondary | ICD-10-CM

## 2022-09-27 DIAGNOSIS — Z86018 Personal history of other benign neoplasm: Secondary | ICD-10-CM

## 2022-09-27 DIAGNOSIS — D485 Neoplasm of uncertain behavior of skin: Secondary | ICD-10-CM | POA: Diagnosis not present

## 2022-09-27 DIAGNOSIS — L818 Other specified disorders of pigmentation: Secondary | ICD-10-CM

## 2022-09-27 DIAGNOSIS — Z1283 Encounter for screening for malignant neoplasm of skin: Secondary | ICD-10-CM

## 2022-09-27 DIAGNOSIS — Z85828 Personal history of other malignant neoplasm of skin: Secondary | ICD-10-CM

## 2022-09-27 DIAGNOSIS — D2370 Other benign neoplasm of skin of unspecified lower limb, including hip: Secondary | ICD-10-CM

## 2022-09-27 DIAGNOSIS — L918 Other hypertrophic disorders of the skin: Secondary | ICD-10-CM

## 2022-09-27 DIAGNOSIS — L578 Other skin changes due to chronic exposure to nonionizing radiation: Secondary | ICD-10-CM

## 2022-09-27 DIAGNOSIS — L814 Other melanin hyperpigmentation: Secondary | ICD-10-CM

## 2022-09-27 MED ORDER — HYDROCORTISONE 2.5 % EX CREA: TOPICAL_CREAM | Freq: Two times a day (BID) | CUTANEOUS | 3 refills | Status: DC | PRN

## 2022-09-27 MED ORDER — KETOCONAZOLE 2 % EX CREA: TOPICAL_CREAM | CUTANEOUS | 2 refills | Status: DC

## 2022-09-27 NOTE — Patient Instructions (Addendum)
Intertrigo - rash in groin area  Mix hydrocortisone with ketaconazole 2% twice a day. If improved, decrease to hydrocortisone and ketaconazole mixed once a day. If still clear, decrease to ketaconazole only.  Recommend OTC benzoyl peroxide cleanser, wash affected areas daily in shower, let sit several minutes prior to rinsing.  May bleach towels if not rinsed off completely.  Recommended brands include Panoxyl 4% Creamy Wash, CeraVe Acne Foaming Cream wash, or Cetaphil Gentle Clear Complexion-Clearing BPO Acne Cleanser. OR Recommend using [employer] daily, leave on for 1-2 minutes before rinsing off.   Melanoma ABCDEs  Melanoma is the most dangerous type of skin cancer, and is the leading cause of death from skin disease.  You are more likely to develop melanoma if you: Have light-colored skin, light-colored eyes, or red or blond hair Spend a lot of time in the sun Tan regularly, either outdoors or in a tanning bed Have had blistering sunburns, especially during childhood Have a close family member who has had a melanoma Have atypical moles or large birthmarks  Early detection of melanoma is key since treatment is typically straightforward and cure rates are extremely high if we catch it early.   The first sign of melanoma is often a change in a mole or a new dark spot.  The ABCDE system is a way of remembering the signs of melanoma.  A for asymmetry:  The two halves do not match. B for border:  The edges of the growth are irregular. C for color:  A mixture of colors are present instead of an even brown color. D for diameter:  Melanomas are usually (but not always) greater than 47m - the size of a pencil eraser. E for evolution:  The spot keeps changing in size, shape, and color.  Please check your skin once per month between visits. You can use a small mirror in front and a large mirror behind you to keep an eye on the back side or your body.   If you see any new or changing  lesions before your next follow-up, please call to schedule a visit.  Please continue daily skin protection including broad spectrum sunscreen SPF 30+ to sun-exposed areas, reapplying every 2 hours as needed when you're outdoors.   Staying in the shade or wearing long sleeves, sun glasses (UVA+UVB protection) and wide brim hats (4-inch brim around the entire circumference of the hat) are also recommended for sun protection.   Due to recent changes in healthcare laws, you may see results of your pathology and/or laboratory studies on MyChart before the doctors have had a chance to review them. We understand that in some cases there may be results that are confusing or concerning to you. Please understand that not all results are received at the same time and often the doctors may need to interpret multiple results in order to provide you with the best plan of care or course of treatment. Therefore, we ask that you please give u[place]2 business days to thoroughly review all your results before contacting the office for clarification. Should we see a critical lab result, you will be contacted sooner.   If You Need Anything After Your Visit  If you have any questions or concerns for your doctor, please call our main line at [phone] press option 4 to reach your doctor's medical assistant. If no one answers, please leave a voicemail as directed and we will return your call as soon as possible. Messages left after  4 pm will be answered the following business day.   You may also send [place] a message via [place]. We typically respond to MyChart messages within 1-2 business days.  For prescription refills, please ask your pharmacy to contact our office. Our fax number is [phone].  If you have an urgent issue when the clinic is closed that cannot wait until the next business day, you can page your doctor at the number below.    Please note that while we do our best to be available for urgent issues  outside of office hours, we are not available 24/7.   If you have an urgent issue and are unable to reach [place], you may choose to seek medical care at your doctor's office, retail clinic, urgent care center, or emergency room.  If you have a medical emergency, please immediately call 911 or go to the emergency department.  Pager Numbers  - Dr. Nehemiah Massed: [phone]  - Dr. Laurence Ferrari: [phone]  - Dr. Nicole Kindred: [phone]  In the event of inclement weather, please call our main line at [phone] for an update on the status of any delays or closures.  Dermatology Medication Tips: Please keep the boxes that topical medications come in in order to help keep track of the instructions about where and how to use these. Pharmacies typically print the medication instructions only on the boxes and not directly on the medication tubes.   If your medication is too expensive, please contact our office at [phone] option 4 or send [place] a message through [place].   We are unable to tell what your co-pay for medications will be in advance as this is different depending on your insurance coverage. However, we may be able to find a substitute medication at lower cost or fill out paperwork to get insurance to cover a needed medication.   If a prior authorization is required to get your medication covered by your insurance company, please allow [place] 1-2 business days to complete this process.  Drug prices often vary depending on where the prescription is filled and some pharmacies may offer cheaper prices.  The website www.goodrx.com contains coupons for medications through different pharmacies. The prices here do not account for what the cost may be with help from insurance (it may be cheaper with your insurance), but the website can give you the price if you did not use any insurance.  - You can print the associated coupon and take it with your prescription to the pharmacy.  - You may also stop by our  office during regular business hours and pick up a GoodRx coupon card.  - If you need your prescription sent electronically to a different pharmacy, notify our office through [facility] or by phone at [phone] option 4.     Si Usted Necesita Algo Despus de Su Visita  Tambin puede enviarnos un mensaje a travs de Pharmacist, community. Por lo general respondemos a los mensajes de MyChart en el transcurso de 1 a 2 das hbiles.  Para renovar recetas, por favor pida a su farmacia que se ponga en contacto con nuestra oficina. Harland Dingwall de fax es [place] [phone].  Si tiene un asunto urgente cuando la clnica est cerrada y que no puede esperar hasta el siguiente da hbil, puede llamar/localizar a su doctor(a) al nmero que aparece a continuacin.   Por favor, tenga en cuenta que aunque hacemos todo lo posible para estar disponibles para asuntos urgentes fuera del horario de oficina, no estamos disponibles las  [place], los 7 das de la [place].   Si tiene un problema urgente y no puede comunicarse con nosotros, puede optar por buscar atencin mdica  en el consultorio de su doctor(a), en una clnica privada, en un centro de atencin urgente o en una sala de emergencias.  Si tiene Engineering geologist, por favor llame inmediatamente al 911 o vaya a la sala de emergencias.  Nmeros de bper  - Dr. Nehemiah Massed: [phone]  - Dra. Moye: [phone]  - Dra. Nicole Kindred: [phone]  En caso de inclemencias del [place], por favor llame a Johnsie Kindred principal al [phone] para una actualizacin sobre el [place] de cualquier retraso o cierre.  Consejos para la medicacin en dermatologa: Por favor, guarde las cajas en las que vienen los medicamentos de uso tpico para ayudarle a seguir las instrucciones sobre dnde y cmo usarlos. Las farmacias generalmente imprimen las instrucciones del medicamento slo en las cajas y no directamente en los tubos del [place].   Si su  medicamento es muy caro, por favor, pngase en contacto con Zigmund Daniel llamando al [phone] y presione la opcin 4 o envenos un mensaje a travs de Pharmacist, community.   No podemos decirle cul ser su copago por los medicamentos por adelantado ya que esto es diferente dependiendo de la cobertura de su seguro. Sin embargo, es posible que podamos encontrar un medicamento sustituto a Electrical engineer un formulario para que el seguro cubra el medicamento que se considera necesario.   Si se requiere una autorizacin previa para que su compaa de seguros [place] su medicamento, por favor permtanos de 1 a 2 das hbiles para completar este proceso.  Los precios de los medicamentos varan con frecuencia dependiendo del Environmental consultant de dnde se surte la receta y alguna farmacias pueden ofrecer precios ms baratos.  El sitio web www.goodrx.com tiene cupones para medicamentos de Airline pilot. Los precios aqu no tienen en cuenta lo que podra costar con la ayuda del seguro (puede ser ms barato con su seguro), pero el sitio web puede darle el precio si no utiliz Research scientist (physical sciences).  - Puede imprimir el cupn correspondiente y llevarlo con su receta a la farmacia.  - Tambin puede pasar por nuestra oficina durante el horario de atencin regular y Charity fundraiser una tarjeta de cupones de GoodRx.  - Si necesita que su receta se enve electrnicamente a una farmacia diferente, informe a nuestra oficina a travs de MyChart de [place] o por telfono llamando al [phone] y presione la opcin 4.

## 2022-09-27 NOTE — Progress Notes (Signed)
Follow-Up Visit   Subjective  Pamela Lee is a 46 y.o. female who presents for the following: Annual Exam.  The patient presents for Total-Body Skin Exam (TBSE) for skin cancer screening and mole check.  The patient has spots, moles and lesions to be evaluated, some may be new or changing. She has a growth on the upper medial thigh that gets irritated and growing. She has a history of BCC on the right forehead. History of dysplastic nevus of the right knee, severe.    The following portions of the chart were reviewed this encounter and updated as appropriate:       Review of Systems:  No other skin or systemic complaints except as noted in HPI or Assessment and Plan.  Objective  Well appearing patient in no apparent distress; mood and affect are within normal limits.  A full examination was performed including scalp, head, eyes, ears, nose, lips, neck, chest, axillae, abdomen, back, buttocks, bilateral upper extremities, bilateral lower extremities, hands, feet, fingers, toes, fingernails, and toenails. All findings within normal limits unless otherwise noted below.  upper forehead Blue macule  back Follicular based pink papules/pustules  right labia majora 1.5 cm fleshy nodule  perianal area Well demarcated erythema and hyperpigmentation.    Assessment & Plan  Skin cancer screening performed today.  Actinic Damage - chronic, secondary to cumulative UV radiation exposure/sun exposure over time - diffuse scaly erythematous macules with underlying dyspigmentation - Recommend daily broad spectrum sunscreen SPF 30+ to sun-exposed areas, reapply every 2 hours as needed.  - Recommend staying in the shade or wearing long sleeves, sun glasses (UVA+UVB protection) and wide brim hats (4-inch brim around the entire circumference of the hat). - Call for new or changing lesions.  Lentigines - Scattered tan macules - Due to sun exposure - Benign-appearing, observe - Recommend  daily broad spectrum sunscreen SPF 30+ to sun-exposed areas, reapply every 2 hours as needed. - Call for any changes  Seborrheic Keratoses - Stuck-on, waxy, tan-brown papules and/or plaques  - Benign-appearing - Discussed benign etiology and prognosis. - Observe - Call for any changes  Acrochordons (Skin Tags) - Fleshy, skin-colored pedunculated papules - Benign appearing.  - Observe. - If desired, they can be removed with an in office procedure that is not covered by insurance. - Please call the clinic if you notice any new or changing lesions.  Melanocytic Nevi - Tan-brown and/or pink-flesh-colored symmetric macules and papules - Benign appearing on exam today - Observation - Call clinic for new or changing moles - Recommend daily use of broad spectrum spf 30+ sunscreen to sun-exposed areas.   Dermatofibroma - Firm pink/brown papulenodule with dimple sign, calf - Benign appearing - Call for any changes  Sebaceous Hyperplasia - Small yellow papules with a central dell - Benign - Observe  History of Basal Cell Carcinoma of the Skin - No evidence of recurrence today of the right forehead - Recommend regular full body skin exams - Recommend daily broad spectrum sunscreen SPF 30+ to sun-exposed areas, reapply every 2 hours as needed.  - Call if any new or changing lesions are noted between office visits  History of Dysplastic Nevus - No evidence of recurrence today of the right lateral knee - Recommend regular full body skin exams - Recommend daily broad spectrum sunscreen SPF 30+ to sun-exposed areas, reapply every 2 hours as needed.  - Call if any new or changing lesions are noted between office visits  Tattoo upper forehead  Pencil tattoo. H/o pencil injury when child  Benign. Observation.   Folliculitis back  Recommend OTC benzoyl peroxide cleanser, wash affected areas daily in shower, let sit several minutes prior to rinsing.  May bleach towels if not rinsed  off completely.  Recommended brands include Panoxyl 4% Creamy Wash, CeraVe Acne Foaming Cream wash, or Cetaphil Gentle Clear Complexion-Clearing BPO Acne Cleanser. OR Recommend using [employer] daily, leave on for 1-2 minutes before rinsing off. This can be purchased online.     Neoplasm of uncertain behavior of skin right labia majora  Epidermal / dermal shaving  Lesion diameter (cm):  1.5 Informed consent: discussed and consent obtained   Patient was prepped and draped in usual sterile fashion: Area prepped with alcohol. Anesthesia: the lesion was anesthetized in a standard fashion   Anesthetic:  1% lidocaine w/ epinephrine 1-100,000 buffered w/ 8.4% NaHCO3 Instrument used: flexible razor blade   Hemostasis achieved with: pressure, aluminum chloride and electrodesiccation   Outcome: patient tolerated procedure well   Post-procedure details: wound care instructions given   Post-procedure details comment:  Ointment and small bandage applied  Specimen 1 - Surgical pathology Differential Diagnosis: Fibrolipoma vs other Check Margins: No  Discussed resulting small scar with shave removal, and possible recurrence of lesion.  Recommend vaseline ointment to area daily and cover until healed.    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 Condition is bothersome/symptomatic for patient. Currently flared.   Intertrigo is a chronic recurrent rash that occurs in skin fold areas that may be associated with friction; heat; moisture; yeast; fungus; and bacteria.  It is exacerbated by increased movement / activity; sweating; and higher atmospheric temperature.  Mix hydrocortisone 2.5% cream with ketaconazole 2% twice a day. If improved, decrease to hydrocortisone and ketaconazole mixed once a day. If still clear, decrease to ketaconazole only.  hydrocortisone 2.5 % cream - perianal area Apply topically 2 (two) times  daily as needed (Rash).  ketoconazole (NIZORAL) 2 % cream - perianal area Apply to affected areas rash in groin area once to twice daily as directed.   Return in about 1 year (around 09/28/2023) for TBSE.  IJamesetta Orleans, CMA, am acting as scribe for Brendolyn Patty, MD .  Documentation: I have reviewed the above documentation for accuracy and completeness, and I agree with the above.  Brendolyn Patty MD

## 2022-10-09 ENCOUNTER — Telehealth: Payer: Self-pay

## 2022-10-09 NOTE — Telephone Encounter (Signed)
-----   Message from Brendolyn Patty, MD sent at 10/09/2022  8:40 AM EST ----- Skin (M), right labia EXCISION, ACROCHORDON, INFLAMED  Benign irritated skin tag - please call patient

## 2022-10-09 NOTE — Telephone Encounter (Signed)
Left message for patient to call for biopsy results. 

## 2022-10-09 NOTE — Telephone Encounter (Signed)
Patient advised of results. aw

## 2022-11-21 ENCOUNTER — Other Ambulatory Visit: Payer: Self-pay | Admitting: Obstetrics and Gynecology

## 2022-11-21 DIAGNOSIS — Z1231 Encounter for screening mammogram for malignant neoplasm of breast: Secondary | ICD-10-CM

## 2022-12-12 ENCOUNTER — Ambulatory Visit
Admission: RE | Admit: 2022-12-12 | Discharge: 2022-12-12 | Disposition: A | Discharge status: Home / Self Care | Payer: 59 | Source: Ambulatory Visit | Attending: Obstetrics and Gynecology | Admitting: Obstetrics and Gynecology

## 2022-12-12 DIAGNOSIS — Z1231 Encounter for screening mammogram for malignant neoplasm of breast: Secondary | ICD-10-CM | POA: Insufficient documentation

## 2023-10-08 ENCOUNTER — Ambulatory Visit (INDEPENDENT_AMBULATORY_CARE_PROVIDER_SITE_OTHER): Payer: 59 | Admitting: Dermatology

## 2023-10-08 DIAGNOSIS — Z1283 Encounter for screening for malignant neoplasm of skin: Secondary | ICD-10-CM

## 2023-10-08 DIAGNOSIS — Z86018 Personal history of other benign neoplasm: Secondary | ICD-10-CM

## 2023-10-08 DIAGNOSIS — D229 Melanocytic nevi, unspecified: Secondary | ICD-10-CM

## 2023-10-08 DIAGNOSIS — D239 Other benign neoplasm of skin, unspecified: Secondary | ICD-10-CM

## 2023-10-08 DIAGNOSIS — L821 Other seborrheic keratosis: Secondary | ICD-10-CM

## 2023-10-08 DIAGNOSIS — L738 Other specified follicular disorders: Secondary | ICD-10-CM

## 2023-10-08 DIAGNOSIS — W908XXA Exposure to other nonionizing radiation, initial encounter: Secondary | ICD-10-CM

## 2023-10-08 DIAGNOSIS — L578 Other skin changes due to chronic exposure to nonionizing radiation: Secondary | ICD-10-CM | POA: Diagnosis not present

## 2023-10-08 DIAGNOSIS — L820 Inflamed seborrheic keratosis: Secondary | ICD-10-CM

## 2023-10-08 DIAGNOSIS — D1801 Hemangioma of skin and subcutaneous tissue: Secondary | ICD-10-CM

## 2023-10-08 DIAGNOSIS — D225 Melanocytic nevi of trunk: Secondary | ICD-10-CM

## 2023-10-08 DIAGNOSIS — L814 Other melanin hyperpigmentation: Secondary | ICD-10-CM

## 2023-10-08 DIAGNOSIS — L304 Erythema intertrigo: Secondary | ICD-10-CM

## 2023-10-08 DIAGNOSIS — Z85828 Personal history of other malignant neoplasm of skin: Secondary | ICD-10-CM

## 2023-10-08 DIAGNOSIS — D2361 Other benign neoplasm of skin of right upper limb, including shoulder: Secondary | ICD-10-CM

## 2023-10-08 DIAGNOSIS — L818 Other specified disorders of pigmentation: Secondary | ICD-10-CM

## 2023-10-08 DIAGNOSIS — L918 Other hypertrophic disorders of the skin: Secondary | ICD-10-CM

## 2023-10-08 MED ORDER — HYDROCORTISONE 2.5 % EX CREA: TOPICAL_CREAM | CUTANEOUS | 6 refills | Status: DC

## 2023-10-08 MED ORDER — KETOCONAZOLE 2 % EX CREA: TOPICAL_CREAM | CUTANEOUS | 11 refills | Status: DC

## 2023-10-08 NOTE — Patient Instructions (Addendum)
Cryotherapy Aftercare  Wash gently with soap and water everyday.   Apply Vaseline and Band-Aid daily until healed.    Seborrheic Keratosis  What causes seborrheic keratoses? Seborrheic keratoses are harmless, common skin growths that first appear during adult life.  As time goes by, more growths appear.  Some people may develop a large number of them.  Seborrheic keratoses appear on both covered and uncovered body parts.  They are not caused by sunlight.  The tendency to develop seborrheic keratoses can be inherited.  They vary in color from skin-colored to gray, brown, or even black.  They can be either smooth or have a rough, warty surface.   Seborrheic keratoses are superficial and look as if they were stuck on the skin.  Under the microscope this type of keratosis looks like layers upon layers of skin.  That is why at times the top layer may seem to fall off, but the rest of the growth remains and re-grows.    Treatment Seborrheic keratoses do not need to be treated, but can easily be removed in the office.  Seborrheic keratoses often cause symptoms when they rub on clothing or jewelry.  Lesions can be in the way of shaving.  If they become inflamed, they can cause itching, soreness, or burning.  Removal of a seborrheic keratosis can be accomplished by freezing, burning, or surgery. If any spot bleeds, scabs, or grows rapidly, please return to have it checked, as these can be an indication of a skin cancer.  Due to recent changes in healthcare laws, you may see results of your pathology and/or laboratory studies on MyChart before the doctors have had a chance to review them. We understand that in some cases there may be results that are confusing or concerning to you. Please understand that not all results are received at the same time and often the doctors may need to interpret multiple results in order to provide you with the best plan of care or course of treatment. Therefore, we ask that you  please give [place] 2 business days to thoroughly review all your results before contacting the office for clarification. Should we see a critical lab result, you will be contacted sooner.   Start ketoconazole 2% cream once or twice a day as needed for rash.  Start hydrocortisone 2.5% cream once or twice a day for up to 2 weeks as needed for rash.   Mix equal amounts of hydrocortisone 2.5% cream with ketaconazole 2% cream and apply to affected areas twice a day.  If improved, decrease to hydrocortisone and ketaconazole mixed once a day. If still clear, decrease to ketaconazole cream only, once daily to help prevent flares.  Do not use the hydrocortisone cream for maintenance, since long term use of a topical steroid can thin the skin.  May repeat regimen as needed for flares.   Topical steroids (such as triamcinolone, fluocinolone, fluocinonide, mometasone, clobetasol, halobetasol, betamethasone, hydrocortisone) can cause thinning and lightening of the skin if they are used for too long in the same area. Your physician has selected the right strength medicine for your problem and area affected on the body. Please use your medication only as directed by your physician to prevent side effects.   To prevent recurrence, recommend OTC Zeasorb AF powder to body folds daily after shower.  It is often found in the athlete's foot section in the pharmacy.  Avoid using powders that contain cornstarch.     If You Need Anything After Your Visit  If you have any questions or concerns for your doctor, please call our main line at [phone] and press option 4 to reach your doctor's medical assistant. If no one answers, please leave a voicemail as directed and we will return your call as soon as possible. Messages left after 4 pm will be answered the following business day.   You may also send [place] a message via MyChart. We typically respond to MyChart messages within 1-2 business days.  For prescription refills,  please ask your pharmacy to contact our office. Our fax number is [phone].  If you have an urgent issue when the clinic is closed that cannot wait until the next business day, you can page your doctor at the number below.    Please note that while we do our best to be available for urgent issues outside of office hours, we are not available 24/7.   If you have an urgent issue and are unable to reach [place], you may choose to seek medical care at your doctor's office, retail clinic, urgent care center, or emergency room.  If you have a medical emergency, please immediately call 911 or go to the emergency department.  Pager Numbers  - Dr. Gwen Pounds: [phone]  - Dr. Roseanne Reno: [phone]  - Dr. Katrinka Blazing: [phone]   In the event of inclement weather, please call our main line at [phone] for an update on the status of any delays or closures.  Dermatology Medication Tips: Please keep the boxes that topical medications come in in order to help keep track of the instructions about where and how to use these. Pharmacies typically print the medication instructions only on the boxes and not directly on the medication tubes.   If your medication is too expensive, please contact our office at [phone] option 4 or send [place] a message through MyChart.   We are unable to tell what your co-pay for medications will be in advance as this is different depending on your insurance coverage. However, we may be able to find a substitute medication at lower cost or fill out paperwork to get insurance to cover a needed medication.   If a prior authorization is required to get your medication covered by your insurance company, please allow [place] 1-2 business days to complete this process.  Drug prices often vary depending on where the prescription is filled and some pharmacies may offer cheaper prices.  The website www.goodrx.com contains coupons for medications through different pharmacies. The prices  here do not account for what the cost may be with help from insurance (it may be cheaper with your insurance), but the website can give you the price if you did not use any insurance.  - You can print the associated coupon and take it with your prescription to the pharmacy.  - You may also stop by our office during regular business hours and pick up a GoodRx coupon card.  - If you need your prescription sent electronically to a different pharmacy, notify our office through [facility] or by phone at [phone] option 4.     Si Usted Necesita Algo Despus de Su Visita  Tambin puede enviarnos un mensaje a travs de Clinical cytogeneticist. Por lo general respondemos a los mensajes de MyChart en el transcurso de 1 a 2 das hbiles.  Para renovar recetas, por favor pida a su farmacia que se ponga en contacto con nuestra oficina. Annie Sable de fax es [place] [phone].  Si tiene un asunto urgente cuando la [employer]  est cerrada y que no puede esperar hasta el siguiente da hbil, puede llamar/localizar a su doctor(a) al nmero que aparece a continuacin.   Por favor, tenga en cuenta que aunque hacemos todo lo posible para estar disponibles para asuntos urgentes fuera del horario de [place], no estamos disponibles las 24 horas del da, los 7 [address]  [address] de la [employer].   Si tiene un problema urgente y no puede comunicarse con nosotros, puede optar por buscar atencin mdica  en el consultorio de su doctor(a), en una clnica privada, en un centro de atencin urgente o en una sala de emergencias.  Si tiene Engineer, drilling, por favor llame inmediatamente al 911 o vaya a la sala de emergencias.  Nmeros de bper  - Dr. Gwen Pounds: [phone]  - Dra. Roseanne Reno: [phone]  - Dr. Katrinka Blazing: [phone]   En caso de inclemencias del tiempo, por favor llame a Lacy Duverney principal al [phone] para una actualizacin sobre el [place] de cualquier retraso o cierre.  Consejos para la medicacin en  dermatologa: Por favor, guarde las cajas en las que vienen los medicamentos de uso tpico para ayudarle a seguir las instrucciones sobre dnde y cmo usarlos. Las farmacias generalmente imprimen las instrucciones del medicamento slo en las cajas y no directamente en los tubos del [place].   Si su medicamento es muy caro, por favor, pngase en contacto con Rolm Gala llamando al [phone] y presione la opcin 4 o envenos un mensaje a travs de Clinical cytogeneticist.   No podemos decirle cul ser su copago por los medicamentos por adelantado ya que esto es diferente dependiendo de la cobertura de su seguro. Sin embargo, es posible que podamos encontrar un medicamento sustituto a Audiological scientist un formulario para que el seguro cubra el medicamento que se considera necesario.   Si se requiere una autorizacin previa para que su compaa de seguros [place] su medicamento, por favor permtanos de 1 a 2 das hbiles para completar [address]  Los precios de los medicamentos varan con frecuencia dependiendo del Environmental consultant de dnde se surte la receta y alguna farmacias pueden ofrecer precios ms baratos.  El sitio web www.goodrx.com tiene cupones para medicamentos de Health and safety inspector. Los precios aqu no tienen en cuenta lo que podra costar con la ayuda del seguro (puede ser ms barato con su seguro), pero el sitio web puede darle el precio si no utiliz Tourist information centre manager.  - Puede imprimir el cupn correspondiente y llevarlo con su receta a la farmacia.  - Tambin puede pasar por nuestra oficina durante el horario de atencin regular y Education officer, museum una tarjeta de cupones de GoodRx.  - Si necesita que su receta se enve electrnicamente a una farmacia diferente, informe a nuestra oficina a travs de MyChart de [place] o por telfono llamando al [phone] y presione la opcin 4.

## 2023-10-08 NOTE — Progress Notes (Signed)
Follow-Up Visit   Subjective  Pamela Lee is a 47 y.o. female who presents for the following: Skin Cancer Screening and Full Body Skin Exam hx of BCC, Dysplastic nevus, check spot forehead 24m, scaly, picks at, check spot bil arms, hard feeling, dry spot back, skin tag R bra line, got caught on something and hx of bleeding, hx of Intertrigo perianal HC 2.5% cr and Ketoconazole 2% cr prn flares  The patient presents for Total-Body Skin Exam (TBSE) for skin cancer screening and mole check. The patient has spots, moles and lesions to be evaluated, some may be new or changing and the patient may have concern these could be cancer.    The following portions of the chart were reviewed this encounter and updated as appropriate: medications, allergies, medical history  Review of Systems:  No other skin or systemic complaints except as noted in HPI or Assessment and Plan.  Objective  Well appearing patient in no apparent distress; mood and affect are within normal limits.  A full examination was performed including scalp, head, eyes, ears, nose, lips, neck, chest, axillae, abdomen, back, buttocks, bilateral upper extremities, bilateral lower extremities, hands, feet, fingers, toes, fingernails, and toenails. All findings within normal limits unless otherwise noted below.   Relevant physical exam findings are noted in the Assessment and Plan.  L upper forehead x 1 Stuck on waxy papule with erythema  Assessment & Plan   SKIN CANCER SCREENING PERFORMED TODAY.  ACTINIC DAMAGE - Chronic condition, secondary to cumulative UV/sun exposure - diffuse scaly erythematous macules with underlying dyspigmentation - Recommend daily broad spectrum sunscreen SPF 30+ to sun-exposed areas, reapply every 2 hours as needed.  - Staying in the shade or wearing long sleeves, sun glasses (UVA+UVB protection) and wide brim hats (4-inch brim around the entire circumference of the hat) are also recommended for sun  protection.  - Call for new or changing lesions.  LENTIGINES, SEBORRHEIC KERATOSES, HEMANGIOMAS - Benign normal skin lesions - Benign-appearing - Call for any changes  MELANOCYTIC NEVI - Tan-brown and/or pink-flesh-colored symmetric macules and papules - L forearm 6.98mm brown pap with emerging hair - L mid back 8.73mm fleshy brown papule - L foot dorsum 3.10mm brown papule - R 4th webspace ~8.0 x 4.28mm fleshy brown papule - Benign appearing on exam today - Observation - Call clinic for new or changing moles - Recommend daily use of broad spectrum spf 30+ sunscreen to sun-exposed areas.   HISTORY OF BASAL CELL CARCINOMA OF THE SKIN - No evidence of recurrence today- R forehead - Recommend regular full body skin exams - Recommend daily broad spectrum sunscreen SPF 30+ to sun-exposed areas, reapply every 2 hours as needed.  - Call if any new or changing lesions are noted between office visits    HISTORY OF DYSPLASTIC NEVUS No evidence of recurrence today- R lat knee Recommend regular full body skin exams Recommend daily broad spectrum sunscreen SPF 30+ to sun-exposed areas, reapply every 2 hours as needed.  Call if any new or changing lesions are noted between office visits    Sebaceous Hyperplasia face - Small yellow papules with a central dell - Benign-appearing - Observe. Call for changes.   DERMATOFIBROMA L tricep, R tricep Exam: Firm pink/brown papulenodule with dimple sign. Treatment Plan: A dermatofibroma is a benign growth possibly related to trauma, such as an insect bite, cut from shaving, or inflamed acne-type bump.  Treatment options to remove include shave or excision with resulting scar and risk  of recurrence.  Since benign-appearing and not bothersome, will observe for now.    Acrochordons (Skin Tags) R axilla, R flank, lower abdomen - Fleshy, skin-colored pedunculated papules - Benign appearing.  - Observe. - If desired, they can be removed with an in  office procedure that is not covered by insurance. Discussed cosmetic removal $115 for up to 15 - Please call the clinic if you notice any new or changing lesions.    TATTOO Pencil lead injury when child Exam: blue macule central upper forehead  Treatment Plan: Benign, observe   INFLAMED SEBORRHEIC KERATOSIS L upper forehead x 1 Vs Irritated Sebaceous Hyperplasia Symptomatic, irritating, patient would like treated. Destruction of lesion - L upper forehead x 1  Destruction method: cryotherapy   Informed consent: discussed and consent obtained   Lesion destroyed using liquid nitrogen: Yes   Region frozen until ice ball extended beyond lesion: Yes   Outcome: patient tolerated procedure well with no complications   Post-procedure details: wound care instructions given   Additional details:  Prior to procedure, discussed risks of blister formation, small wound, skin dyspigmentation, or rare scar following cryotherapy. Recommend Vaseline ointment to treated areas while healing.  ERYTHEMA INTERTRIGO   Related Medications ketoconazole (NIZORAL) 2 % cream Apply to affected areas rash buttocks, under breast bid prn flares hydrocortisone 2.5 % cream Apply topically as directed. qd/bid aa rash buttocks, inframammary for up to 2 weeks prn flares  INTERTRIGO Perianal, inframammary Exam: Perianal clear per patient, mild erythema inframammary  Chronic and persistent condition with duration or expected duration over one year. Condition is improving with treatment but not currently at goal.    Intertrigo is a chronic recurrent rash that occurs in skin fold areas that may be associated with friction; heat; moisture; yeast; fungus; and bacteria.  It is exacerbated by increased movement / activity; sweating; and higher atmospheric temperature.  Use of an absorbant powder such as Zeasorb AF powder or other OTC antifungal powder to the area daily can prevent rash recurrence. Other options to help  keep the area dry include blow drying the area after bathing or using antiperspirant products such as Duradry sweat minimizing gel.  Treatment Plan: Cont ketoconazole 2% cream once or twice a day as needed for rash.  Cont  hydrocortisone 2.5% cream once or twice a day for up to 2 weeks as needed for rash.   Mix equal amounts of hydrocortisone 2.5% cream with ketaconazole 2% cream and apply to affected areas twice a day.  If improved, decrease to hydrocortisone and ketaconazole mixed once a day. If still clear, decrease to ketaconazole cream only, once daily to help prevent flares.  Do not use the hydrocortisone cream for maintenance, since long term use of a topical steroid can thin the skin.  May repeat regimen as needed for flares.   Topical steroids (such as triamcinolone, fluocinolone, fluocinonide, mometasone, clobetasol, halobetasol, betamethasone, hydrocortisone) can cause thinning and lightening of the skin if they are used for too long in the same area. Your physician has selected the right strength medicine for your problem and area affected on the body. Please use your medication only as directed by your physician to prevent side effects.   To prevent recurrence, recommend OTC Zeasorb AF powder to body folds daily after shower.  It is often found in the athlete's foot section in the pharmacy.  Avoid using powders that contain cornstarch.     Return in about 1 year (around 10/07/2024) for TBSE, Hx of BCC,  Hx of Dysplastic nevi.  I, Ardis Rowan, RMA, am acting as scribe for Willeen Niece, MD .   Documentation: I have reviewed the above documentation for accuracy and completeness, and I agree with the above.  Willeen Niece, MD

## 2023-11-23 ENCOUNTER — Other Ambulatory Visit: Payer: Self-pay | Admitting: Certified Nurse Midwife

## 2023-11-23 DIAGNOSIS — N63 Unspecified lump in unspecified breast: Secondary | ICD-10-CM

## 2023-12-07 ENCOUNTER — Ambulatory Visit
Admission: RE | Admit: 2023-12-07 | Discharge: 2023-12-07 | Disposition: A | Discharge status: Home / Self Care | Payer: 59 | Source: Ambulatory Visit | Attending: Certified Nurse Midwife | Admitting: Certified Nurse Midwife

## 2023-12-07 DIAGNOSIS — N63 Unspecified lump in unspecified breast: Secondary | ICD-10-CM | POA: Insufficient documentation

## 2024-09-30 ENCOUNTER — Encounter: Payer: 59 | Admitting: Dermatology

## 2024-10-07 ENCOUNTER — Ambulatory Visit: Admitting: Dermatology

## 2024-10-07 DIAGNOSIS — L719 Rosacea, unspecified: Secondary | ICD-10-CM

## 2024-10-07 DIAGNOSIS — L820 Inflamed seborrheic keratosis: Secondary | ICD-10-CM

## 2024-10-07 DIAGNOSIS — Z85828 Personal history of other malignant neoplasm of skin: Secondary | ICD-10-CM

## 2024-10-07 DIAGNOSIS — Z86018 Personal history of other benign neoplasm: Secondary | ICD-10-CM

## 2024-10-07 DIAGNOSIS — L578 Other skin changes due to chronic exposure to nonionizing radiation: Secondary | ICD-10-CM

## 2024-10-07 DIAGNOSIS — Z1283 Encounter for screening for malignant neoplasm of skin: Secondary | ICD-10-CM

## 2024-10-07 DIAGNOSIS — D239 Other benign neoplasm of skin, unspecified: Secondary | ICD-10-CM

## 2024-10-07 DIAGNOSIS — L918 Other hypertrophic disorders of the skin: Secondary | ICD-10-CM

## 2024-10-07 DIAGNOSIS — D1801 Hemangioma of skin and subcutaneous tissue: Secondary | ICD-10-CM

## 2024-10-07 DIAGNOSIS — L821 Other seborrheic keratosis: Secondary | ICD-10-CM

## 2024-10-07 DIAGNOSIS — L304 Erythema intertrigo: Secondary | ICD-10-CM

## 2024-10-07 DIAGNOSIS — D229 Melanocytic nevi, unspecified: Secondary | ICD-10-CM

## 2024-10-07 DIAGNOSIS — L814 Other melanin hyperpigmentation: Secondary | ICD-10-CM

## 2024-10-07 MED ORDER — HYDROCORTISONE 2.5 % EX CREA: TOPICAL_CREAM | CUTANEOUS | 6 refills | Status: AC

## 2024-10-07 MED ORDER — KETOCONAZOLE 2 % EX CREA: TOPICAL_CREAM | CUTANEOUS | 11 refills | Status: AC

## 2024-10-07 NOTE — Patient Instructions (Addendum)
 Cryotherapy Aftercare  Wash gently with soap and water everyday.   Apply Vaseline and Band-Aid daily until healed.     Skin tag removal generally is considered cosmetic and not covered by insurance.  Cosmetic removal fee is $115 for up to 15 tags removed. You can contact your insurance to see if skin tag removal is a medically necessary covered benefit.  Even if covered, copay and deductible payments would apply. CPT code: 88799 Diagnosis code: L91.8    Due to recent changes in healthcare laws, you may see results of your pathology and/or laboratory studies on MyChart before the doctors have had a chance to review them. We understand that in some cases there may be results that are confusing or concerning to you. Please understand that not all results are received at the same time and often the doctors may need to interpret multiple results in order to provide you with the best plan of care or course of treatment. Therefore, we ask that you please give us  2 business days to thoroughly review all your results before contacting the office for clarification. Should we see a critical lab result, you will be contacted sooner.   If You Need Anything After Your Visit  If you have any questions or concerns for your doctor, please call our main line at [phone] and press option 4 to reach your doctor's medical assistant. If no one answers, please leave a voicemail as directed and we will return your call as soon as possible. Messages left after 4 pm will be answered the following business day.   You may also send us  a message via MyChart. We typically respond to MyChart messages within 1-2 business days.  For prescription refills, please ask your pharmacy to contact our office. Our fax number is [phone].  If you have an urgent issue when the clinic is closed that cannot wait until the next business day, you can page your doctor at the number below.    Please note that while we do our best  to be available for urgent issues outside of office hours, we are not available 24/7.   If you have an urgent issue and are unable to reach us , you may choose to seek medical care at your doctor's office, retail clinic, urgent care center, or emergency room.  If you have a medical emergency, please immediately call 911 or go to the emergency department.  Pager Numbers  - Dr. Hester: [phone]  - Dr. Jackquline: [phone]  - Dr. Claudene: [phone]   - Dr. Raymund: [phone]  In the event of inclement weather, please call our main line at [phone] for an update on the status of any delays or closures.  Dermatology Medication Tips: Please keep the boxes that topical medications come in in order to help keep track of the instructions about where and how to use these. Pharmacies typically print the medication instructions only on the boxes and not directly on the medication tubes.   If your medication is too expensive, please contact our office at [phone] option 4 or send us  a message through MyChart.   We are unable to tell what your co-pay for medications will be in advance as this is different depending on your insurance coverage. However, we may be able to find a substitute medication at lower cost or fill out paperwork to get insurance to cover a needed medication.   If a prior authorization is required to get your medication covered by your insurance company, please  allow us  1-2 business days to complete this process.  Drug prices often vary depending on where the prescription is filled and some pharmacies may offer cheaper prices.  The website www.goodrx.com contains coupons for medications through different pharmacies. The prices here do not account for what the cost may be with help from insurance (it may be cheaper with your insurance), but the website can give you the price if you did not use any insurance.  - You can print the associated coupon and take it with  your prescription to the pharmacy.  - You may also stop by our office during regular business hours and pick up a GoodRx coupon card.  - If you need your prescription sent electronically to a different pharmacy, notify our office through [facility] or by phone at [phone] option 4.     Si Usted Necesita Algo Despus de Su Visita  Tambin puede enviarnos un mensaje a travs de Clinical Cytogeneticist. Por lo general respondemos a los mensajes de MyChart en el transcurso de 1 a 2 das hbiles.  Para renovar recetas, por favor pida a su farmacia que se ponga en contacto con nuestra oficina. Randi lakes de fax es [place] [phone].  Si tiene un asunto urgente cuando la clnica est cerrada y que no puede esperar hasta el siguiente da hbil, puede llamar/localizar a su doctor(a) al nmero que aparece a continuacin.   Por favor, tenga en cuenta que aunque hacemos todo lo posible para estar disponibles para asuntos urgentes fuera del horario de [place], no estamos disponibles las 24 horas del da, los 7 [address]  [address] de la [place].   Si tiene un problema urgente y no puede comunicarse con nosotros, puede optar por buscar atencin mdica  en el consultorio de su doctor(a), en una clnica privada, en un centro de atencin urgente o en una sala de emergencias.  Si tiene engineer, drilling, por favor llame inmediatamente al 911 o vaya a la sala de emergencias.  Nmeros de bper  - Dr. Hester: [phone]  - Dra. Jackquline: [phone]  - Dr. Claudene: [phone]  - Dra. Kitts: [phone]  En caso de inclemencias del [place], por favor llame a nuestra lnea principal al [phone] para una actualizacin sobre el estado de cualquier retraso o cierre.  Consejos para la medicacin en dermatologa: Por favor, guarde las cajas en las que vienen los medicamentos de uso tpico para ayudarle a seguir las instrucciones sobre dnde y cmo usarlos. Las farmacias generalmente imprimen las instrucciones del  medicamento slo en las cajas y no directamente en los tubos del [place].   Si su medicamento es muy caro, por favor, pngase en contacto con landry rieger llamando al [phone] y presione la opcin 4 o envenos un mensaje a travs de Clinical Cytogeneticist.   No podemos decirle cul ser su copago por los medicamentos por adelantado ya que esto es diferente dependiendo de la cobertura de su seguro. Sin embargo, es posible que podamos encontrar un medicamento sustituto a audiological scientist un formulario para que el seguro cubra el medicamento que se considera necesario.   Si se requiere una autorizacin previa para que su compaa de seguros cubra su medicamento, por favor permtanos de 1 a 2 das hbiles para completar este proceso.  Los precios de los medicamentos varan con frecuencia dependiendo del environmental consultant de dnde se surte la receta y alguna farmacias pueden ofrecer precios ms baratos.  El sitio web www.goodrx.com tiene cupones para medicamentos de health and safety inspector. Los precios aqu no  tienen en cuenta lo que podra costar con la ayuda del seguro (puede ser ms barato con su seguro), pero el sitio web puede darle el precio si no visual merchandiser.  - Puede imprimir el cupn correspondiente y llevarlo con su receta a la farmacia.  - Tambin puede pasar por nuestra oficina durante el horario de atencin regular y education officer, museum una tarjeta de cupones de GoodRx.  - Si necesita que su receta se enve electrnicamente a una farmacia diferente, informe a nuestra oficina a travs de MyChart de [place] o por telfono llamando al [phone] y presione la opcin 4.

## 2024-10-07 NOTE — Progress Notes (Signed)
 Follow-Up Visit   Subjective  Pamela Lee is a 48 y.o. female who presents for the following: Skin Cancer Screening and Full Body Skin Exam  The patient presents for Total-Body Skin Exam (TBSE) for skin cancer screening and mole check. The patient has spots, moles and lesions to be evaluated, some may be new or changing, including irritated spots on the back, axilla, right neck.      The following portions of the chart were reviewed this encounter and updated as appropriate: medications, allergies, medical history  Review of Systems:  No other skin or systemic complaints except as noted in HPI or Assessment and Plan.  Objective  Well appearing patient in no apparent distress; mood and affect are within normal limits.  A full examination was performed including scalp, head, eyes, ears, nose, lips, neck, chest, axillae, abdomen, back, buttocks, bilateral upper extremities, bilateral lower extremities, hands, feet, fingers, toes, fingernails, and toenails. All findings within normal limits unless otherwise noted below.   Relevant physical exam findings are noted in the Assessment and Plan.  R ant neck x 1, mid back at braline x 2 (3) Erythematous stuck-on, waxy papule  Assessment & Plan   SKIN CANCER SCREENING PERFORMED TODAY.  ACTINIC DAMAGE - Chronic condition, secondary to cumulative UV/sun exposure - diffuse scaly erythematous macules with underlying dyspigmentation - Recommend daily broad spectrum sunscreen SPF 30+ to sun-exposed areas, reapply every 2 hours as needed.  - Staying in the shade or wearing long sleeves, sun glasses (UVA+UVB protection) and wide brim hats (4-inch brim around the entire circumference of the hat) are also recommended for sun protection.  - Call for new or changing lesions.  LENTIGINES, SEBORRHEIC KERATOSES, HEMANGIOMAS - Benign normal skin lesions - Benign-appearing - Call for any changes  MELANOCYTIC NEVI - Tan-brown and/or pink-flesh-colored  symmetric macules and papules - L forearm 6 mm brown pap with emerging hair - L mid back 8 mm fleshy brown papule (vs SK) - L foot dorsum 3 mm brown papule - R foot 4th webspace ~8 x 4 mm fleshy brown papule - Benign appearing on exam today - Observation - Call clinic for new or changing moles - Recommend daily use of broad spectrum spf 30+ sunscreen to sun-exposed areas.   HISTORY OF BASAL CELL CARCINOMA OF THE SKIN - No evidence of recurrence today- R forehead - Recommend regular full body skin exams - Recommend daily broad spectrum sunscreen SPF 30+ to sun-exposed areas, reapply every 2 hours as needed.  - Call if any new or changing lesions are noted between office visits    HISTORY OF DYSPLASTIC NEVUS No evidence of recurrence today- R lat knee Recommend regular full body skin exams Recommend daily broad spectrum sunscreen SPF 30+ to sun-exposed areas, reapply every 2 hours as needed.  Call if any new or changing lesions are noted between office visits  DERMATOFIBROMA L tricep, R tricep, L lat lower leg Exam: Firm pink/brown papulenodule with dimple sign. Treatment Plan: A dermatofibroma is a benign growth possibly related to trauma, such as an insect bite, cut from shaving, or inflamed acne-type bump.  Treatment options to remove include shave or excision with resulting scar and risk of recurrence.  Since benign-appearing and not bothersome, will observe for now.     Acrochordons (Skin Tags) - Fleshy, skin-colored pedunculated papules, bil axilla, R lower abdomen - Benign appearing.  - Observe. - If desired, they can be removed with an in office procedure that is not covered by  insurance. Info given and patient will see if covered by insurance.  - Please call the clinic if you notice any new or changing lesions. Skin tag removal generally is considered cosmetic and not covered by insurance.  Cosmetic removal fee is $115 for up to 15 tags removed. You can contact your insurance  to see if skin tag removal is a medically necessary covered benefit.  Even if covered, copay and deductible payments would apply. CPT code: 88799 Diagnosis code: L91.8  - pt may call back to schedule for removal if covered by insurance  TATTOO Pencil lead injury when child Exam: blue macule central upper forehead   Treatment Plan: Benign, observe  ROSACEA Exam Mid face erythema with telangiectasias  Chronic and persistent condition with duration or expected duration over one year. Condition is not symptomatic/ bothersome to patient.    Rosacea is a chronic progressive skin condition usually affecting the face of adults, causing redness and/or acne bumps. It is treatable but not curable. It sometimes affects the eyes (ocular rosacea) as well. It may respond to topical and/or systemic medication and can flare with stress, sun exposure, alcohol, exercise, topical steroids (including hydrocortisone /cortisone 10) and some foods.  Daily application of broad spectrum spf 30+ sunscreen to face is recommended to reduce flares.  Treatment Plan Pt defers Rx treatment.   INTERTRIGO Perianal, inframammary Exam: Inframammary fold mild erythema, Perianal clear per patient.   Chronic and persistent condition with duration or expected duration over one year. Condition is improving with treatment but not currently at goal.     Intertrigo is a chronic recurrent rash that occurs in skin fold areas that may be associated with friction; heat; moisture; yeast; fungus; and bacteria.  It is exacerbated by increased movement / activity; sweating; and higher atmospheric temperature.  Use of an absorbant powder such as Zeasorb AF powder or other OTC antifungal powder to the area daily can prevent rash recurrence. Other options to help keep the area dry include blow drying the area after bathing or using antiperspirant products such as Duradry sweat minimizing gel.   Treatment Plan: Cont ketoconazole  2% cream  once or twice a day as needed for rash.  Cont  hydrocortisone  2.5% cream once or twice a day for up to 2 weeks as needed for rash.    Mix equal amounts of hydrocortisone  2.5% cream with ketaconazole 2% cream and apply to affected areas twice a day.  If improved, decrease to hydrocortisone  and ketaconazole mixed once a day. If still clear, decrease to ketaconazole cream only, once daily to help prevent flares.  Do not use the hydrocortisone  cream for maintenance, since long term use of a topical steroid can thin the skin.  May repeat regimen as needed for flares.    Topical steroids (such as triamcinolone, fluocinolone, fluocinonide, mometasone, clobetasol, halobetasol, betamethasone, hydrocortisone ) can cause thinning and lightening of the skin if they are used for too long in the same area. Your physician has selected the right strength medicine for your problem and area affected on the body. Please use your medication only as directed by your physician to prevent side effects.    To prevent recurrence, recommend OTC Zeasorb AF powder to body folds daily after shower.  It is often found in the athlete's foot section in the pharmacy.  Avoid using powders that contain cornstarch.   INFLAMED SEBORRHEIC KERATOSIS (3) R ant neck x 1, mid back at braline x 2 (3) Symptomatic, irritating, patient would like treated. - Destruction  of lesion - R ant neck x 1, mid back at braline x 2 (3)  Destruction method: cryotherapy   Informed consent: discussed and consent obtained   Lesion destroyed using liquid nitrogen: Yes   Region frozen until ice ball extended beyond lesion: Yes   Outcome: patient tolerated procedure well with no complications   Post-procedure details: wound care instructions given   Additional details:  Prior to procedure, discussed risks of blister formation, small wound, skin dyspigmentation, or rare scar following cryotherapy. Recommend Vaseline ointment to treated areas while  healing.   ERYTHEMA INTERTRIGO   This Visit - ketoconazole  (NIZORAL ) 2 % cream - Apply to affected areas rash buttocks, under breast bid prn flares - hydrocortisone  2.5 % cream - Apply topically as directed. qd/bid aa rash buttocks, inframammary for up to 2 weeks prn flares Return 1-2 years, for TBSE, Hx Dysplastic Nevus, Hx BCC.  IAndrea Kerns, CMA, am acting as scribe for Rexene Rattler, MD .   Documentation: I have reviewed the above documentation for accuracy and completeness, and I agree with the above.  Rexene Rattler, MD

## 2024-10-13 ENCOUNTER — Other Ambulatory Visit: Payer: Self-pay | Admitting: Medical Genetics

## 2024-10-20 ENCOUNTER — Other Ambulatory Visit: Payer: Self-pay

## 2024-10-20 ENCOUNTER — Ambulatory Visit: Admitting: Anesthesiology

## 2024-10-20 ENCOUNTER — Encounter
Admission: RE | Disposition: A | Discharge status: Home / Self Care | Payer: Self-pay | Source: Home / Self Care | Attending: Gastroenterology

## 2024-10-20 ENCOUNTER — Encounter: Payer: Self-pay | Admitting: Gastroenterology

## 2024-10-20 ENCOUNTER — Ambulatory Visit
Admission: RE | Admit: 2024-10-20 | Discharge: 2024-10-20 | Disposition: A | Discharge status: Home / Self Care | Attending: Gastroenterology | Admitting: Gastroenterology

## 2024-10-20 DIAGNOSIS — Z1211 Encounter for screening for malignant neoplasm of colon: Secondary | ICD-10-CM | POA: Diagnosis present

## 2024-10-20 DIAGNOSIS — R000 Tachycardia, unspecified: Secondary | ICD-10-CM | POA: Insufficient documentation

## 2024-10-20 DIAGNOSIS — Z87891 Personal history of nicotine dependence: Secondary | ICD-10-CM | POA: Diagnosis not present

## 2024-10-20 DIAGNOSIS — Z83719 Family history of colon polyps, unspecified: Secondary | ICD-10-CM | POA: Diagnosis not present

## 2024-10-20 DIAGNOSIS — Z79899 Other long term (current) drug therapy: Secondary | ICD-10-CM | POA: Insufficient documentation

## 2024-10-20 DIAGNOSIS — E6689 Other obesity not elsewhere classified: Secondary | ICD-10-CM | POA: Insufficient documentation

## 2024-10-20 DIAGNOSIS — R059 Cough, unspecified: Secondary | ICD-10-CM | POA: Diagnosis not present

## 2024-10-20 DIAGNOSIS — K640 First degree hemorrhoids: Secondary | ICD-10-CM | POA: Insufficient documentation

## 2024-10-20 DIAGNOSIS — D120 Benign neoplasm of cecum: Secondary | ICD-10-CM | POA: Insufficient documentation

## 2024-10-20 DIAGNOSIS — K219 Gastro-esophageal reflux disease without esophagitis: Secondary | ICD-10-CM | POA: Insufficient documentation

## 2024-10-20 DIAGNOSIS — R7303 Prediabetes: Secondary | ICD-10-CM | POA: Insufficient documentation

## 2024-10-20 DIAGNOSIS — Z6841 Body Mass Index (BMI) 40.0 and over, adult: Secondary | ICD-10-CM | POA: Diagnosis not present

## 2024-10-20 DIAGNOSIS — D123 Benign neoplasm of transverse colon: Secondary | ICD-10-CM | POA: Insufficient documentation

## 2024-10-20 DIAGNOSIS — I10 Essential (primary) hypertension: Secondary | ICD-10-CM | POA: Insufficient documentation

## 2024-10-20 DIAGNOSIS — G4730 Sleep apnea, unspecified: Secondary | ICD-10-CM | POA: Insufficient documentation

## 2024-10-20 DIAGNOSIS — R519 Headache, unspecified: Secondary | ICD-10-CM | POA: Insufficient documentation

## 2024-10-20 DIAGNOSIS — Z800 Family history of malignant neoplasm of digestive organs: Secondary | ICD-10-CM | POA: Diagnosis not present

## 2024-10-20 HISTORY — PX: POLYPECTOMY: SHX149

## 2024-10-20 HISTORY — PX: COLONOSCOPY: SHX5424

## 2024-10-20 SURGERY — COLONOSCOPY
Anesthesia: General

## 2024-10-20 MED ORDER — PROPOFOL 1000 MG/100ML IV EMUL
INTRAVENOUS | Status: AC
  Filled 2024-10-20: qty 100

## 2024-10-20 MED ORDER — PROPOFOL 10 MG/ML IV BOLUS
INTRAVENOUS | Status: DC | PRN
  Administered 2024-10-20: 100 mg via INTRAVENOUS

## 2024-10-20 MED ORDER — PROPOFOL 500 MG/50ML IV EMUL
INTRAVENOUS | Status: DC | PRN
  Administered 2024-10-20: 150 ug/kg/min via INTRAVENOUS

## 2024-10-20 MED ORDER — SODIUM CHLORIDE 0.9 % IV SOLN: INTRAVENOUS | Status: DC

## 2024-10-20 NOTE — H&P (Signed)
 "  Pre-Procedure H&P   Patient ID: Pamela Lee is a 48 y.o. female.  Gastroenterology Provider: Elspeth Ozell Jungling, DO  Referring Provider: Selinda Quan, PA PCP: Quan Selinda Moose, PA-C  Date: 10/20/2024  HPI Ms. Pamela Lee is a 48 y.o. female who presents today for Colonoscopy for Personal and Family history of colon polyps. Family history of colon cancer.  Last underwent csy 09/2021- 1ssp, ih 08/2018- ta 2014- normal   Past Medical History:  Diagnosis Date   Allergic genetic state    Atypical chest pain    Basal cell carcinoma 2013   right forehead (treated by Dr. Towana)   Colon polyp    Endometriosis of uterus    Fibroid    GERD (gastroesophageal reflux disease)    Headache    Hx of dysplastic nevus 07/29/2015   R lat knee - severe atypia, excised 08/31/2015   Hypertension    Mitral valve prolapse    Obesity    Sleep apnea    Vertigo     Past Surgical History:  Procedure Laterality Date   ABDOMINAL HYSTERECTOMY  04/23/2015   BREAST CYST ASPIRATION Left 10/26/2021   CESAREAN SECTION     COLONOSCOPY WITH PROPOFOL  N/A 09/02/2018   Procedure: COLONOSCOPY WITH PROPOFOL ;  Surgeon: Viktoria Lamar DASEN, MD;  Location: [facility] ENDOSCOPY;  Service: Endoscopy;  Laterality: N/A;   COLONOSCOPY WITH PROPOFOL  N/A 10/13/2021   Procedure: COLONOSCOPY WITH PROPOFOL ;  Surgeon: Jungling Elspeth Ozell, DO;  Location: [facility] ENDOSCOPY;  Service: Gastroenterology;  Laterality: N/A;   CYSTOSCOPY N/A 05/03/2015   Procedure: CYSTOSCOPY;  Surgeon: Heather Penton, MD;  Location: [facility] ORS;  Service: Gynecology;  Laterality: N/A;   endometrial ablation  06/23/2011   ESOPHAGOGASTRODUODENOSCOPY (EGD) WITH PROPOFOL  N/A 10/13/2021   Procedure: ESOPHAGOGASTRODUODENOSCOPY (EGD) WITH PROPOFOL ;  Surgeon: Jungling Elspeth Ozell, DO;  Location: [facility] ENDOSCOPY;  Service: Gastroenterology;  Laterality: N/A;   NOVASURE ABLATION     POLYPECTOMY     ROBOTIC ASSISTED TOTAL HYSTERECTOMY N/A  05/03/2015   Procedure: ROBOTIC ASSISTED TOTAL HYSTERECTOMY/BIL.SALPINGECTOMY;  Surgeon: Heather Penton, MD;  Location: [facility] ORS;  Service: Gynecology;  Laterality: N/A;   TUBAL LIGATION      Family History MGF, MGM, G GM and aunt all with CRC Mother and sister with colon polyps No other h/o GI disease or malignancy  Review of Systems  Constitutional:  Negative for activity change, appetite change, chills, diaphoresis, fatigue, fever and unexpected weight change.  HENT:  Negative for trouble swallowing and voice change.   Respiratory:  Negative for shortness of breath and wheezing.   Cardiovascular:  Negative for chest pain, palpitations and leg swelling.  Gastrointestinal:  Negative for abdominal distention, abdominal pain, anal bleeding, blood in stool, constipation, diarrhea, nausea, rectal pain and vomiting.  Musculoskeletal:  Negative for arthralgias and myalgias.  Skin:  Negative for color change and pallor.  Neurological:  Negative for dizziness, syncope and weakness.  Psychiatric/Behavioral:  Negative for confusion.   All other systems reviewed and are negative.    Medications Medications Ordered Prior to Encounter[1]  Pertinent medications related to GI and procedure were reviewed by me with the patient prior to the procedure  Current Medications[2]  sodium chloride  20 mL/hr at 10/20/24 9278       Allergies[3] Allergies were reviewed by me prior to the procedure  Objective   Body mass index is 49.39 kg/m. Vitals:   10/20/24 0713  BP: (!) 161/87  Pulse: (!) 103  Resp: 18  Temp: ROLLEN)  97.5 F (36.4 C)  TempSrc: Temporal  SpO2: 96%  Weight: 124.5 kg  Height: 5' 2.5 (1.588 m)     Physical Exam Vitals and nursing note reviewed.  Constitutional:      General: She is not in acute distress.    Appearance: Normal appearance. She is obese. She is not ill-appearing, toxic-appearing or diaphoretic.  HENT:     Head: Normocephalic and atraumatic.     Nose: Nose  normal.     Mouth/Throat:     Mouth: Mucous membranes are moist.     Pharynx: Oropharynx is clear.  Eyes:     General: No scleral icterus.    Extraocular Movements: Extraocular movements intact.  Cardiovascular:     Rate and Rhythm: Regular rhythm. Tachycardia present.     Heart sounds: Normal heart sounds. No murmur heard.    No friction rub. No gallop.  Pulmonary:     Effort: Pulmonary effort is normal. No respiratory distress.     Breath sounds: Normal breath sounds. No wheezing, rhonchi or rales.  Abdominal:     General: Bowel sounds are normal. There is no distension.     Palpations: Abdomen is soft.     Tenderness: There is no abdominal tenderness. There is no guarding or rebound.  Musculoskeletal:     Cervical back: Neck supple.     Right lower leg: No edema.     Left lower leg: No edema.  Skin:    General: Skin is warm and dry.     Coloration: Skin is not jaundiced or pale.  Neurological:     General: No focal deficit present.     Mental Status: She is alert and oriented to person, place, and time. Mental status is at baseline.  Psychiatric:        Mood and Affect: Mood normal.        Behavior: Behavior normal.        Thought Content: Thought content normal.        Judgment: Judgment normal.      Assessment:  Ms. Pamela Lee is a 48 y.o. female  who presents today for Colonoscopy for Personal and Family history of colon polyps. Family history of colon cancer .  Plan:  Colonoscopy with possible intervention today  Colonoscopy with possible biopsy, control of bleeding, polypectomy, and interventions as necessary has been discussed with the patient/patient representative. Informed consent was obtained from the patient/patient representative after explaining the indication, nature, and risks of the procedure including but not limited to death, bleeding, perforation, missed neoplasm/lesions, cardiorespiratory compromise, and reaction to medications. Opportunity for  questions was given and appropriate answers were provided. Patient/patient representative has verbalized understanding is amenable to undergoing the procedure.   Elspeth Ozell Jungling, DO  [facility]astroenter[facility]  Portions of the record may have been created with voice recognition software. Occasional wrong-word or 'sound-a-like' substitutions may have occurred due to the inherent limitations of voice recognition software.  Read the chart carefully and recognize, using context, where substitutions may have occurred.    [1]  No current facility-administered medications on file prior to encounter.   Current Outpatient Medications on File Prior to Encounter  Medication Sig Dispense Refill   lisinopril-hydrochlorothiazide  (ZESTORETIC) 20-12.5 MG tablet Take 1 tablet by mouth daily.     omeprazole (PRILOSEC) 20 MG capsule Take 20 mg by mouth 2 (two) times daily before a meal. 30-45 minutes before meal     topiramate (TOPAMAX) 25 MG tablet Take 1.5 tablet  twice a day     acetaminophen  (TYLENOL ) 500 MG tablet Take 1,000 mg by mouth every 6 (six) hours as needed for mild pain or headache.      cetirizine (ZYRTEC) 10 MG tablet Take 10 mg by mouth daily as needed for allergies.     fexofenadine (ALLEGRA) 180 MG tablet Take 180 mg by mouth daily.     ibuprofen  (ADVIL ,MOTRIN ) 600 MG tablet Take 1 tablet (600 mg total) by mouth every 6 (six) hours as needed for fever or headache. 30 tablet 0   Misc Natural Products (AIRBORNE ELDERBERRY) CHEW Chew by mouth.    [2]  Current Facility-Administered Medications:    0.9 %  sodium chloride  infusion, , Intravenous, Continuous, Onita Elspeth Sharper, DO, Last Rate: 20 mL/hr at 10/20/24 0721, New Bag at 10/20/24 0721 [3]  Allergies Allergen Reactions   Lactose Intolerance (Gi) Diarrhea   "

## 2024-10-20 NOTE — Anesthesia Preprocedure Evaluation (Signed)
 "                                  Anesthesia Evaluation  Patient identified by MRN, date of birth, ID band Patient awake    Reviewed: Allergy  & Precautions, NPO status , Patient's Chart, lab work & pertinent test results  History of Anesthesia Complications Negative for: history of anesthetic complications  Airway Mallampati: III  TM Distance: >3 FB Neck ROM: full  Mouth opening: Limited Mouth Opening  Dental  (+) Teeth Intact, Caps, Dental Advidsory Given   Pulmonary neg shortness of breath, sleep apnea , neg COPD, Recent URI , Residual Cough, former smoker   Pulmonary exam normal  + decreased breath sounds      Cardiovascular Exercise Tolerance: Good hypertension, Pt. on medications (-) angina (-) Past MI and (-) Cardiac Stents Normal cardiovascular exam(-) dysrhythmias + Valvular Problems/Murmurs MVP  Rhythm:Regular Rate:Normal     Neuro/Psych  Headaches, neg Seizures  negative psych ROS   GI/Hepatic negative GI ROS, Neg liver ROS,GERD  ,,  Endo/Other  diabetes (borderline)  Class 4 obesity  Renal/GU negative Renal ROS  negative genitourinary   Musculoskeletal negative musculoskeletal ROS (+)    Abdominal  (+) + obese  Peds negative pediatric ROS (+)  Hematology negative hematology ROS (+)   Anesthesia Other Findings Past Medical History: No date: Allergic genetic state No date: Atypical chest pain No date: Basal cell carcinoma     Comment:  right forehead (treated by Dr. Towana) No date: Cancer [facility])     Comment:  basal cell No date: Colon polyp No date: Endometriosis of uterus No date: Fibroid No date: GERD (gastroesophageal reflux disease) No date: Headache 07/29/2015: Hx of dysplastic nevus     Comment:  R lat knee - severe atypia, excised 08/31/2015 No date: Hypertension No date: Mitral valve prolapse No date: Obesity No date: Sleep apnea No date: Vertigo  Past Surgical History: 04/23/2015: ABDOMINAL HYSTERECTOMY No date:  CESAREAN SECTION 09/02/2018: COLONOSCOPY WITH PROPOFOL ; N/A     Comment:  Procedure: COLONOSCOPY WITH PROPOFOL ;  Surgeon: Viktoria Lamar DASEN, MD;  Location: [facility] ENDOSCOPY;  Service:               Endoscopy;  Laterality: N/A; 05/03/2015: CYSTOSCOPY; N/A     Comment:  Procedure: CYSTOSCOPY;  Surgeon: Heather Penton, MD;                Location: [facility] ORS;  Service: Gynecology;  Laterality:               N/A; 06/23/2011: endometrial ablation No date: NOVASURE ABLATION No date: POLYPECTOMY 05/03/2015: ROBOTIC ASSISTED TOTAL HYSTERECTOMY; N/A     Comment:  Procedure: ROBOTIC ASSISTED TOTAL               HYSTERECTOMY/BIL.SALPINGECTOMY;  Surgeon: Heather Penton, MD;  Location: [facility] ORS;  Service: Gynecology;                Laterality: N/A; No date: TUBAL LIGATION  BMI    Body Mass Index: 49.60 kg/m      Reproductive/Obstetrics negative OB ROS  Anesthesia Physical Anesthesia Plan  ASA: 3  Anesthesia Plan: General   Post-op Pain Management:    Induction: Intravenous  PONV Risk Score and Plan: 3 and Propofol  infusion, TIVA and Treatment may vary due to age or medical condition  Airway Management Planned: Natural Airway and Nasal Cannula  Additional Equipment:   Intra-op Plan:   Post-operative Plan:   Informed Consent: I have reviewed the patients History and Physical, chart, labs and discussed the procedure including the risks, benefits and alternatives for the proposed anesthesia with the patient or authorized representative who has indicated his/her understanding and acceptance.     Dental Advisory Given  Plan Discussed with: CRNA and Surgeon  Anesthesia Plan Comments:          Anesthesia Quick Evaluation  "

## 2024-10-20 NOTE — Interval H&P Note (Signed)
 History and Physical Interval Note: Preprocedure H&P from 10/20/2024  was reviewed and there was no interval change after seeing and examining the patient.  Written consent was obtained from the patient after discussion of risks, benefits, and alternatives. Patient has consented to proceed with Colonoscopy with possible intervention   10/20/2024 7:30 AM  Pamela Lee  has presented today for surgery, with the diagnosis of Personal history of adenomatous and serrated colon polyps (Z86.0101) Family hx colonic polyps (Z83.719) Family hx of colon cancer (Z80.0).  The various methods of treatment have been discussed with the patient and family. After consideration of risks, benefits and other options for treatment, the patient has consented to  Procedures: COLONOSCOPY (N/A) as a surgical intervention.  The patient's history has been reviewed, patient examined, no change in status, stable for surgery.  I have reviewed the patient's chart and labs.  Questions were answered to the patient's satisfaction.     Elspeth Ozell Jungling

## 2024-10-20 NOTE — Transfer of Care (Signed)
 Immediate Anesthesia Transfer of Care Note  Patient: Pamela Lee  Procedure(s) Performed: COLONOSCOPY POLYPECTOMY, INTESTINE  Patient Location: PACU  Anesthesia Type:General  Level of Consciousness: awake and sedated  Airway & Oxygen Therapy: Patient Spontanous Breathing and Patient connected to face mask oxygen  Post-op Assessment: Report given to RN and Post -op Vital signs reviewed and stable  Post vital signs: Reviewed  Last Vitals:  Vitals Value Taken Time  BP 112/43 10/20/24 08:04  Temp 36.4 C 10/20/24 08:04  Pulse 100 10/20/24 08:04  Resp 25 10/20/24 08:04  SpO2 100 % 10/20/24 08:04    Last Pain:  Vitals:   10/20/24 0804  TempSrc: Tympanic  PainSc: 0-No pain         Complications: There were no known notable events for this encounter.

## 2024-10-20 NOTE — Op Note (Signed)
 [facility] Patient Name: Pamela Lee Procedure Date: 10/20/2024 7:36 AM MRN: [id number] Account #: [id number] Date of Birth: 10/17/1976 Admit Type: Outpatient Age: 48 Room: [facility] ENDO ROOM 2 Gender: Female Note Status: Finalized Instrument Name: Colon Scope [id number] Procedure:             Colonoscopy Indications:           Personal and Family history of colon polyps; Family                         history of colon cancer Providers:             Elspeth Ozell Onita ROSALEA, DO Referring MD:          Selinda Azalee Quan (Referring MD) Medicines:             Monitored Anesthesia Care Complications:         No immediate complications. Estimated blood loss:                         Minimal. Procedure:             Pre-Anesthesia Assessment:                        - Prior to the procedure, a History and Physical was                         performed, and patient medications and allergies were                         reviewed. The patient is competent. The risks and                         benefits of the procedure and the sedation options and                         risks were discussed with the patient. All questions                         were answered and informed consent was obtained.                         Patient identification and proposed procedure were                         verified by the physician, the nurse, the anesthetist                         and the technician in the endoscopy suite. Mental                         Status Examination: alert and oriented. Airway                         Examination: normal oropharyngeal airway and neck                         mobility. Respiratory Examination: clear to  auscultation. CV Examination: RRR, no murmurs, no S3                         or S4. Prophylactic Antibiotics: The patient does not                         require prophylactic antibiotics. Prior                          Anticoagulants: The patient has taken no anticoagulant                         or antiplatelet agents. ASA Grade Assessment: III - A                         patient with severe systemic disease. After reviewing                         the risks and benefits, the patient was deemed in                         satisfactory condition to undergo the procedure. The                         anesthesia plan was to use monitored anesthesia care                         (MAC). Immediately prior to administration of                         medications, the patient was re-assessed for adequacy                         to receive sedatives. The heart rate, respiratory                         rate, oxygen saturations, blood pressure, adequacy of                         pulmonary ventilation, and response to care were                         monitored throughout the procedure. The physical                         status of the patient was re-assessed after the                         procedure.                        After obtaining informed consent, the colonoscope was                         passed under direct vision. Throughout the procedure,                         the patient's blood pressure, pulse, and oxygen  saturations were monitored continuously. The                         Colonoscope was introduced through the anus and                         advanced to the the cecum, identified by appendiceal                         orifice and ileocecal valve. The colonoscopy was                         performed without difficulty. The patient tolerated                         the procedure well. The quality of the bowel                         preparation was evaluated using the BBPS [facility] Bowel                         Preparation Scale) with scores of: Right Colon = 2                         (minor amount of residual staining, small fragments of                         stool  and/or opaque liquid, but mucosa seen well),                         Transverse Colon = 3 (entire mucosa seen well with no                         residual staining, small fragments of stool or opaque                         liquid) and Left Colon = 2 (minor amount of residual                         staining, small fragments of stool and/or opaque                         liquid, but mucosa seen well). The total BBPS score                         equals 7. The quality of the bowel preparation was                         good. The ileocecal valve, appendiceal orifice, and                         rectum were photographed. Findings:      The perianal and digital rectal examinations were normal. Pertinent       negatives include normal sphincter tone.      A 1 to 2 mm polyp was found in the cecum. The polyp was sessile. The       polyp  was removed with a jumbo cold forceps. Resection and retrieval       were complete. Estimated blood loss was minimal.      A 3 to 4 mm polyp was found in the transverse colon. The polyp was       sessile. The polyp was removed with a cold snare. Resection and       retrieval were complete. Estimated blood loss was minimal.      Non-bleeding internal hemorrhoids were found during retroflexion. The       hemorrhoids were Grade I (internal hemorrhoids that do not prolapse).       Estimated blood loss: none.      The exam was otherwise without abnormality on direct and retroflexion       views. Impression:            - One 1 to 2 mm polyp in the cecum, removed with a                         jumbo cold forceps. Resected and retrieved.                        - One 3 to 4 mm polyp in the transverse colon, removed                         with a cold snare. Resected and retrieved.                        - Non-bleeding internal hemorrhoids.                        - The examination was otherwise normal on direct and                         retroflexion  views. Recommendation:        - Patient has a contact number available for                         emergencies. The signs and symptoms of potential                         delayed complications were discussed with the patient.                         Return to normal activities tomorrow. Written                         discharge instructions were provided to the patient.                        - Discharge patient to home.                        - Resume previous diet.                        - Continue present medications.                        - Await pathology results.                        -  Repeat colonoscopy for surveillance based on                         pathology results.                        - Return to referring physician as previously                         scheduled.                        - The findings and recommendations were discussed with                         the patient. Procedure Code(s):     --- Professional ---                        [phone], Colonoscopy, flexible; with removal of                         tumor(s), polyp(s), or other lesion(s) by snare                         technique                        45380, 59, Colonoscopy, flexible; with biopsy, single                         or multiple Diagnosis Code(s):     --- Professional ---                        D12.0, Benign neoplasm of cecum                        D12.3, Benign neoplasm of transverse colon (hepatic                         flexure or splenic flexure)                        K64.0, First degree hemorrhoids CPT copyright 2022 [employer]. All rights reserved. The codes documented in this report are preliminary and upon coder review may  be revised to meet current compliance requirements. Attending Participation:      I personally performed the entire procedure. Elspeth Jungling, DO Elspeth Ozell Jungling DO, DO 10/20/2024 8:04:30 AM This report has been signed  electronically. Number of Addenda: 0 Note Initiated On: 10/20/2024 7:36 AM Scope Withdrawal Time: 0 hours 13 minutes 33 seconds  Total Procedure Duration: 0 hours 16 minutes 31 seconds  Estimated Blood Loss:  Estimated blood loss was minimal.      [facility]

## 2024-10-21 ENCOUNTER — Encounter: Payer: Self-pay | Admitting: Gastroenterology

## 2024-10-21 ENCOUNTER — Other Ambulatory Visit
Admission: RE | Admit: 2024-10-21 | Discharge: 2024-10-21 | Disposition: A | Discharge status: Home / Self Care | Payer: Self-pay | Source: Ambulatory Visit | Attending: Medical Genetics | Admitting: Medical Genetics

## 2024-10-21 LAB — SURGICAL PATHOLOGY

## 2024-10-21 NOTE — Anesthesia Postprocedure Evaluation (Signed)
"   Anesthesia Post Note  Patient: Pamela Lee  Procedure(s) Performed: COLONOSCOPY POLYPECTOMY, INTESTINE  Patient location during evaluation: Endoscopy Anesthesia Type: General Level of consciousness: awake and alert Pain management: pain level controlled Vital Signs Assessment: post-procedure vital signs reviewed and stable Respiratory status: spontaneous breathing, nonlabored ventilation, respiratory function stable and patient connected to nasal cannula oxygen Cardiovascular status: blood pressure returned to baseline and stable Postop Assessment: no apparent nausea or vomiting Anesthetic complications: no   There were no known notable events for this encounter.   Last Vitals:  Vitals:   10/20/24 0815 10/20/24 0821  BP: 111/62 109/65  Pulse: 100 85  Resp: 18 19  Temp:    SpO2: 100% 100%    Last Pain:  Vitals:   10/20/24 0821  TempSrc:   PainSc: 0-No pain                 Prentice Murphy      "

## 2024-11-02 LAB — GENECONNECT MOLECULAR SCREEN: Genetic Analysis Overall Interpretation: NEGATIVE

## 2025-11-03 ENCOUNTER — Encounter: Admitting: Dermatology
